# Patient Record
Sex: Male | Born: 1945 | Race: White | Hispanic: No | Marital: Single | Smoking: Former smoker
Health system: Southern US, Community
[De-identification: ages and names within clinical notes are randomized; demographics above are authoritative.]

## PROBLEM LIST (undated history)

## (undated) DIAGNOSIS — I82629 Acute embolism and thrombosis of deep veins of unspecified upper extremity: Secondary | ICD-10-CM

## (undated) DIAGNOSIS — F32A Depression, unspecified: Secondary | ICD-10-CM

## (undated) DIAGNOSIS — N400 Enlarged prostate without lower urinary tract symptoms: Secondary | ICD-10-CM

## (undated) DIAGNOSIS — F329 Major depressive disorder, single episode, unspecified: Secondary | ICD-10-CM

## (undated) DIAGNOSIS — N289 Disorder of kidney and ureter, unspecified: Secondary | ICD-10-CM

## (undated) DIAGNOSIS — A0472 Enterocolitis due to Clostridium difficile, not specified as recurrent: Secondary | ICD-10-CM

## (undated) DIAGNOSIS — M1990 Unspecified osteoarthritis, unspecified site: Secondary | ICD-10-CM

## (undated) DIAGNOSIS — I2510 Atherosclerotic heart disease of native coronary artery without angina pectoris: Secondary | ICD-10-CM

## (undated) DIAGNOSIS — K219 Gastro-esophageal reflux disease without esophagitis: Secondary | ICD-10-CM

## (undated) DIAGNOSIS — I639 Cerebral infarction, unspecified: Secondary | ICD-10-CM

## (undated) DIAGNOSIS — I10 Essential (primary) hypertension: Secondary | ICD-10-CM

## (undated) DIAGNOSIS — F419 Anxiety disorder, unspecified: Secondary | ICD-10-CM

## (undated) DIAGNOSIS — I5022 Chronic systolic (congestive) heart failure: Secondary | ICD-10-CM

## (undated) DIAGNOSIS — E079 Disorder of thyroid, unspecified: Secondary | ICD-10-CM

## (undated) DIAGNOSIS — I6381 Other cerebral infarction due to occlusion or stenosis of small artery: Secondary | ICD-10-CM

## (undated) HISTORY — DX: Chronic systolic (congestive) heart failure: I50.22

## (undated) HISTORY — PX: CORONARY ARTERY BYPASS GRAFT: SHX141

## (undated) HISTORY — DX: Cerebral infarction, unspecified: I63.9

## (undated) HISTORY — DX: Essential (primary) hypertension: I10

## (undated) HISTORY — DX: Disorder of kidney and ureter, unspecified: N28.9

## (undated) HISTORY — DX: Acute embolism and thrombosis of deep veins of unspecified upper extremity: I82.629

## (undated) HISTORY — DX: Enterocolitis due to Clostridium difficile, not specified as recurrent: A04.72

## (undated) HISTORY — DX: Gastro-esophageal reflux disease without esophagitis: K21.9

## (undated) HISTORY — DX: Major depressive disorder, single episode, unspecified: F32.9

## (undated) HISTORY — DX: Depression, unspecified: F32.A

## (undated) HISTORY — DX: Unspecified osteoarthritis, unspecified site: M19.90

## (undated) HISTORY — DX: Atherosclerotic heart disease of native coronary artery without angina pectoris: I25.10

## (undated) HISTORY — DX: Disorder of thyroid, unspecified: E07.9

## (undated) HISTORY — DX: Benign prostatic hyperplasia without lower urinary tract symptoms: N40.0

## (undated) HISTORY — DX: Other cerebral infarction due to occlusion or stenosis of small artery: I63.81

## (undated) HISTORY — DX: Anxiety disorder, unspecified: F41.9

---

## 2004-05-25 ENCOUNTER — Emergency Department: Payer: Self-pay | Admitting: Emergency Medicine

## 2004-05-25 ENCOUNTER — Other Ambulatory Visit: Payer: Self-pay

## 2004-06-26 ENCOUNTER — Other Ambulatory Visit: Payer: Self-pay

## 2004-06-26 ENCOUNTER — Inpatient Hospital Stay: Payer: Self-pay | Admitting: Rheumatology

## 2004-07-08 ENCOUNTER — Other Ambulatory Visit: Payer: Self-pay

## 2004-07-09 ENCOUNTER — Inpatient Hospital Stay: Payer: Self-pay | Admitting: Anesthesiology

## 2004-07-30 ENCOUNTER — Emergency Department: Payer: Self-pay | Admitting: Emergency Medicine

## 2004-10-25 ENCOUNTER — Inpatient Hospital Stay: Payer: Self-pay | Admitting: Internal Medicine

## 2004-10-30 ENCOUNTER — Inpatient Hospital Stay: Payer: Self-pay | Admitting: Internal Medicine

## 2004-11-10 ENCOUNTER — Encounter: Payer: Self-pay | Admitting: Internal Medicine

## 2004-11-30 ENCOUNTER — Encounter: Payer: Self-pay | Admitting: Internal Medicine

## 2004-12-14 ENCOUNTER — Ambulatory Visit: Payer: Self-pay | Admitting: Internal Medicine

## 2004-12-19 ENCOUNTER — Emergency Department: Payer: Self-pay | Admitting: Unknown Physician Specialty

## 2004-12-19 ENCOUNTER — Other Ambulatory Visit: Payer: Self-pay

## 2004-12-23 ENCOUNTER — Ambulatory Visit: Payer: Self-pay | Admitting: Internal Medicine

## 2004-12-23 DIAGNOSIS — I6990 Unspecified sequelae of unspecified cerebrovascular disease: Secondary | ICD-10-CM | POA: Insufficient documentation

## 2004-12-24 ENCOUNTER — Ambulatory Visit: Payer: Self-pay | Admitting: Internal Medicine

## 2005-01-07 ENCOUNTER — Ambulatory Visit: Payer: Self-pay | Admitting: Internal Medicine

## 2005-01-11 ENCOUNTER — Ambulatory Visit: Payer: Self-pay | Admitting: Internal Medicine

## 2005-01-12 ENCOUNTER — Ambulatory Visit: Payer: Self-pay | Admitting: Internal Medicine

## 2005-01-18 ENCOUNTER — Encounter: Payer: Self-pay | Admitting: Internal Medicine

## 2005-01-19 ENCOUNTER — Emergency Department: Payer: Self-pay | Admitting: Emergency Medicine

## 2005-01-26 ENCOUNTER — Ambulatory Visit: Payer: Self-pay | Admitting: Internal Medicine

## 2005-01-30 ENCOUNTER — Encounter: Payer: Self-pay | Admitting: Internal Medicine

## 2005-02-08 ENCOUNTER — Ambulatory Visit: Payer: Self-pay | Admitting: Internal Medicine

## 2005-02-08 ENCOUNTER — Other Ambulatory Visit: Payer: Self-pay

## 2005-02-09 ENCOUNTER — Inpatient Hospital Stay: Payer: Self-pay | Admitting: Internal Medicine

## 2005-02-23 ENCOUNTER — Ambulatory Visit: Payer: Self-pay | Admitting: Internal Medicine

## 2005-03-04 ENCOUNTER — Ambulatory Visit: Payer: Self-pay | Admitting: Surgery

## 2005-03-08 ENCOUNTER — Ambulatory Visit: Payer: Self-pay | Admitting: Specialist

## 2005-03-10 ENCOUNTER — Ambulatory Visit: Payer: Self-pay | Admitting: Internal Medicine

## 2005-03-17 ENCOUNTER — Ambulatory Visit: Payer: Self-pay | Admitting: Internal Medicine

## 2005-03-30 ENCOUNTER — Ambulatory Visit: Payer: Self-pay | Admitting: Internal Medicine

## 2005-04-27 ENCOUNTER — Ambulatory Visit: Payer: Self-pay | Admitting: Internal Medicine

## 2005-05-11 ENCOUNTER — Ambulatory Visit: Payer: Self-pay | Admitting: Internal Medicine

## 2005-05-30 ENCOUNTER — Inpatient Hospital Stay: Payer: Self-pay | Admitting: Infectious Diseases

## 2005-05-30 ENCOUNTER — Other Ambulatory Visit: Payer: Self-pay

## 2005-05-31 ENCOUNTER — Other Ambulatory Visit: Payer: Self-pay

## 2005-06-01 ENCOUNTER — Ambulatory Visit: Payer: Self-pay | Admitting: *Deleted

## 2005-06-08 ENCOUNTER — Ambulatory Visit: Payer: Self-pay | Admitting: Internal Medicine

## 2005-06-11 ENCOUNTER — Ambulatory Visit: Payer: Self-pay | Admitting: Internal Medicine

## 2005-06-17 ENCOUNTER — Ambulatory Visit: Payer: Self-pay | Admitting: Internal Medicine

## 2005-06-23 ENCOUNTER — Ambulatory Visit: Payer: Self-pay | Admitting: Internal Medicine

## 2005-06-28 ENCOUNTER — Ambulatory Visit: Payer: Self-pay | Admitting: Internal Medicine

## 2005-07-01 ENCOUNTER — Ambulatory Visit: Payer: Self-pay | Admitting: Internal Medicine

## 2005-07-05 ENCOUNTER — Ambulatory Visit: Payer: Self-pay | Admitting: Internal Medicine

## 2005-07-07 ENCOUNTER — Ambulatory Visit: Payer: Self-pay | Admitting: Internal Medicine

## 2005-07-14 ENCOUNTER — Ambulatory Visit: Payer: Self-pay | Admitting: Internal Medicine

## 2005-07-19 ENCOUNTER — Ambulatory Visit: Payer: Self-pay | Admitting: Internal Medicine

## 2005-07-27 ENCOUNTER — Ambulatory Visit: Payer: Self-pay | Admitting: Internal Medicine

## 2005-08-03 ENCOUNTER — Ambulatory Visit: Payer: Self-pay | Admitting: Internal Medicine

## 2005-08-12 ENCOUNTER — Ambulatory Visit: Payer: Self-pay | Admitting: Internal Medicine

## 2005-08-16 ENCOUNTER — Ambulatory Visit: Payer: Self-pay | Admitting: Internal Medicine

## 2005-08-30 ENCOUNTER — Ambulatory Visit: Payer: Self-pay | Admitting: Internal Medicine

## 2005-09-06 ENCOUNTER — Ambulatory Visit: Payer: Self-pay | Admitting: Internal Medicine

## 2005-09-20 ENCOUNTER — Ambulatory Visit: Payer: Self-pay | Admitting: Internal Medicine

## 2005-10-04 ENCOUNTER — Ambulatory Visit: Payer: Self-pay | Admitting: Internal Medicine

## 2005-10-05 ENCOUNTER — Ambulatory Visit: Payer: Self-pay | Admitting: Internal Medicine

## 2005-10-09 ENCOUNTER — Other Ambulatory Visit: Payer: Self-pay

## 2005-10-09 ENCOUNTER — Inpatient Hospital Stay: Payer: Self-pay | Admitting: Internal Medicine

## 2005-10-20 ENCOUNTER — Ambulatory Visit: Payer: Self-pay | Admitting: Internal Medicine

## 2005-10-28 ENCOUNTER — Ambulatory Visit: Payer: Self-pay | Admitting: Internal Medicine

## 2005-12-30 ENCOUNTER — Ambulatory Visit: Payer: Self-pay | Admitting: Internal Medicine

## 2006-01-20 ENCOUNTER — Emergency Department: Payer: Self-pay | Admitting: Emergency Medicine

## 2006-01-20 ENCOUNTER — Other Ambulatory Visit: Payer: Self-pay

## 2006-04-29 ENCOUNTER — Ambulatory Visit: Payer: Self-pay | Admitting: Internal Medicine

## 2006-05-25 ENCOUNTER — Ambulatory Visit: Payer: Self-pay | Admitting: Nephrology

## 2006-05-30 ENCOUNTER — Ambulatory Visit: Payer: Self-pay | Admitting: Internal Medicine

## 2006-07-01 ENCOUNTER — Ambulatory Visit: Payer: Self-pay | Admitting: Internal Medicine

## 2006-07-08 ENCOUNTER — Ambulatory Visit: Payer: Self-pay | Admitting: Internal Medicine

## 2006-09-29 ENCOUNTER — Ambulatory Visit: Payer: Self-pay | Admitting: Internal Medicine

## 2006-10-07 ENCOUNTER — Ambulatory Visit: Payer: Self-pay | Admitting: Internal Medicine

## 2006-10-15 DIAGNOSIS — F3289 Other specified depressive episodes: Secondary | ICD-10-CM | POA: Insufficient documentation

## 2006-10-15 DIAGNOSIS — K219 Gastro-esophageal reflux disease without esophagitis: Secondary | ICD-10-CM

## 2006-10-15 DIAGNOSIS — F329 Major depressive disorder, single episode, unspecified: Secondary | ICD-10-CM

## 2006-10-15 DIAGNOSIS — I2510 Atherosclerotic heart disease of native coronary artery without angina pectoris: Secondary | ICD-10-CM | POA: Insufficient documentation

## 2006-10-15 DIAGNOSIS — E039 Hypothyroidism, unspecified: Secondary | ICD-10-CM | POA: Insufficient documentation

## 2006-10-15 DIAGNOSIS — N400 Enlarged prostate without lower urinary tract symptoms: Secondary | ICD-10-CM | POA: Insufficient documentation

## 2006-11-29 ENCOUNTER — Ambulatory Visit: Payer: Self-pay | Admitting: Urology

## 2006-12-12 ENCOUNTER — Ambulatory Visit: Payer: Self-pay | Admitting: Internal Medicine

## 2006-12-12 DIAGNOSIS — E785 Hyperlipidemia, unspecified: Secondary | ICD-10-CM

## 2006-12-12 DIAGNOSIS — E1149 Type 2 diabetes mellitus with other diabetic neurological complication: Secondary | ICD-10-CM | POA: Insufficient documentation

## 2006-12-14 LAB — CONVERTED CEMR LAB
ALT: 13 units/L (ref 0–40)
Albumin: 3 g/dL — ABNORMAL LOW (ref 3.5–5.2)
BUN: 27 mg/dL — ABNORMAL HIGH (ref 6–23)
CO2: 34 meq/L — ABNORMAL HIGH (ref 19–32)
Calcium: 9.3 mg/dL (ref 8.4–10.5)
Chloride: 112 meq/L (ref 96–112)
Cholesterol: 145 mg/dL (ref 0–200)
Creatinine, Ser: 2.4 mg/dL — ABNORMAL HIGH (ref 0.4–1.5)
Creatinine,U: 104.1 mg/dL
Free T4: 0.9 ng/dL (ref 0.6–1.6)
GFR calc Af Amer: 35 mL/min
GFR calc non Af Amer: 29 mL/min
Glucose, Bld: 42 mg/dL — CL (ref 70–99)
HDL: 47 mg/dL (ref >39.0)
LDL Cholesterol: 70 mg/dL (ref 0–99)
Microalb Creat Ratio: 2836.7 mg/g — ABNORMAL HIGH (ref 0.0–30.0)
Microalb, Ur: 295.3 mg/dL (ref 0.0–1.9)
Phosphorus: 5.2 mg/dL — ABNORMAL HIGH (ref 2.3–4.6)
Potassium: 5.9 meq/L — ABNORMAL HIGH (ref 3.5–5.1)
Sodium: 149 meq/L — ABNORMAL HIGH (ref 135–145)
TSH: 3.52 microintl units/mL (ref 0.35–5.50)
Total CHOL/HDL Ratio: 3.1
Triglycerides: 140 mg/dL (ref 0–149)
VLDL: 28 mg/dL (ref 0–40)

## 2006-12-28 ENCOUNTER — Telehealth (INDEPENDENT_AMBULATORY_CARE_PROVIDER_SITE_OTHER): Payer: Self-pay | Admitting: *Deleted

## 2007-01-09 ENCOUNTER — Encounter (INDEPENDENT_AMBULATORY_CARE_PROVIDER_SITE_OTHER): Payer: Self-pay | Admitting: *Deleted

## 2007-01-12 ENCOUNTER — Telehealth (INDEPENDENT_AMBULATORY_CARE_PROVIDER_SITE_OTHER): Payer: Self-pay | Admitting: *Deleted

## 2007-01-23 ENCOUNTER — Emergency Department: Payer: Self-pay | Admitting: Internal Medicine

## 2007-01-27 ENCOUNTER — Ambulatory Visit: Payer: Self-pay | Admitting: Internal Medicine

## 2007-01-27 DIAGNOSIS — I10 Essential (primary) hypertension: Secondary | ICD-10-CM | POA: Insufficient documentation

## 2007-02-07 ENCOUNTER — Telehealth (INDEPENDENT_AMBULATORY_CARE_PROVIDER_SITE_OTHER): Payer: Self-pay | Admitting: *Deleted

## 2007-02-16 ENCOUNTER — Encounter (INDEPENDENT_AMBULATORY_CARE_PROVIDER_SITE_OTHER): Payer: Self-pay | Admitting: *Deleted

## 2007-02-22 ENCOUNTER — Inpatient Hospital Stay: Payer: Self-pay | Admitting: Internal Medicine

## 2007-02-22 ENCOUNTER — Other Ambulatory Visit: Payer: Self-pay

## 2007-02-22 ENCOUNTER — Encounter (INDEPENDENT_AMBULATORY_CARE_PROVIDER_SITE_OTHER): Payer: Self-pay | Admitting: *Deleted

## 2007-02-23 ENCOUNTER — Telehealth: Payer: Self-pay | Admitting: Internal Medicine

## 2007-02-23 ENCOUNTER — Other Ambulatory Visit: Payer: Self-pay

## 2007-02-24 ENCOUNTER — Encounter: Payer: Self-pay | Admitting: Internal Medicine

## 2007-02-27 ENCOUNTER — Ambulatory Visit: Payer: Self-pay | Admitting: Internal Medicine

## 2007-02-27 DIAGNOSIS — I5022 Chronic systolic (congestive) heart failure: Secondary | ICD-10-CM | POA: Insufficient documentation

## 2007-03-09 ENCOUNTER — Telehealth (INDEPENDENT_AMBULATORY_CARE_PROVIDER_SITE_OTHER): Payer: Self-pay | Admitting: *Deleted

## 2007-03-17 ENCOUNTER — Ambulatory Visit: Payer: Self-pay | Admitting: Internal Medicine

## 2007-03-20 LAB — CONVERTED CEMR LAB: Hgb A1c MFr Bld: 7.9 % — ABNORMAL HIGH (ref 4.6–6.1)

## 2007-03-30 ENCOUNTER — Ambulatory Visit: Payer: Self-pay | Admitting: Internal Medicine

## 2007-03-30 LAB — CONVERTED CEMR LAB: Glucose, Bld: 212 mg/dL

## 2007-04-04 ENCOUNTER — Ambulatory Visit: Payer: Self-pay | Admitting: Internal Medicine

## 2007-04-20 ENCOUNTER — Encounter: Payer: Self-pay | Admitting: Internal Medicine

## 2007-05-10 ENCOUNTER — Telehealth: Payer: Self-pay | Admitting: Internal Medicine

## 2007-05-30 ENCOUNTER — Encounter: Payer: Self-pay | Admitting: Internal Medicine

## 2007-06-02 ENCOUNTER — Telehealth (INDEPENDENT_AMBULATORY_CARE_PROVIDER_SITE_OTHER): Payer: Self-pay | Admitting: *Deleted

## 2007-06-05 ENCOUNTER — Telehealth (INDEPENDENT_AMBULATORY_CARE_PROVIDER_SITE_OTHER): Payer: Self-pay | Admitting: *Deleted

## 2007-06-22 ENCOUNTER — Telehealth: Payer: Self-pay | Admitting: Internal Medicine

## 2007-07-05 ENCOUNTER — Encounter: Payer: Self-pay | Admitting: Internal Medicine

## 2007-07-05 ENCOUNTER — Other Ambulatory Visit: Payer: Self-pay

## 2007-07-05 ENCOUNTER — Inpatient Hospital Stay: Payer: Self-pay | Admitting: Internal Medicine

## 2007-07-10 ENCOUNTER — Encounter: Payer: Self-pay | Admitting: Internal Medicine

## 2007-07-11 ENCOUNTER — Telehealth (INDEPENDENT_AMBULATORY_CARE_PROVIDER_SITE_OTHER): Payer: Self-pay | Admitting: *Deleted

## 2007-07-11 ENCOUNTER — Other Ambulatory Visit: Payer: Self-pay

## 2007-07-11 ENCOUNTER — Encounter: Payer: Self-pay | Admitting: Internal Medicine

## 2007-07-11 ENCOUNTER — Observation Stay: Payer: Self-pay | Admitting: Internal Medicine

## 2007-07-27 ENCOUNTER — Inpatient Hospital Stay: Payer: Self-pay | Admitting: Internal Medicine

## 2007-07-27 ENCOUNTER — Other Ambulatory Visit: Payer: Self-pay

## 2007-08-30 ENCOUNTER — Telehealth (INDEPENDENT_AMBULATORY_CARE_PROVIDER_SITE_OTHER): Payer: Self-pay | Admitting: *Deleted

## 2007-08-31 ENCOUNTER — Ambulatory Visit: Payer: Self-pay | Admitting: Internal Medicine

## 2007-09-04 LAB — CONVERTED CEMR LAB
ALT: 11 units/L (ref 0–53)
AST: 14 units/L (ref 0–37)
Albumin: 3.3 g/dL — ABNORMAL LOW (ref 3.5–5.2)
Alkaline Phosphatase: 99 units/L (ref 39–117)
BUN: 44 mg/dL — ABNORMAL HIGH (ref 6–23)
Basophils Absolute: 0.2 10*3/uL — ABNORMAL HIGH (ref 0.0–0.1)
Basophils Relative: 1.8 % — ABNORMAL HIGH (ref 0.0–1.0)
Bilirubin, Direct: 0.1 mg/dL (ref 0.0–0.3)
CO2: 26 meq/L (ref 19–32)
Calcium: 8.9 mg/dL (ref 8.4–10.5)
Chloride: 107 meq/L (ref 96–112)
Cholesterol: 125 mg/dL (ref 0–200)
Creatinine, Ser: 3.1 mg/dL — ABNORMAL HIGH (ref 0.4–1.5)
Eosinophils Absolute: 0.4 10*3/uL (ref 0.0–0.6)
Eosinophils Relative: 4.9 % (ref 0.0–5.0)
GFR calc Af Amer: 26 mL/min
GFR calc non Af Amer: 22 mL/min
Glucose, Bld: 171 mg/dL — ABNORMAL HIGH (ref 70–99)
HCT: 34.1 % — ABNORMAL LOW (ref 39.0–52.0)
HDL: 38.2 mg/dL — ABNORMAL LOW (ref >39.0)
Hemoglobin: 11.1 g/dL — ABNORMAL LOW (ref 13.0–17.0)
Hgb A1c MFr Bld: 9.1 % — ABNORMAL HIGH (ref 4.6–6.0)
LDL Cholesterol: 53 mg/dL (ref 0–99)
Lymphocytes Relative: 17.3 % (ref 12.0–46.0)
MCHC: 32.7 g/dL (ref 30.0–36.0)
MCV: 96.1 fL (ref 78.0–100.0)
Monocytes Absolute: 0.9 10*3/uL — ABNORMAL HIGH (ref 0.2–0.7)
Monocytes Relative: 10.6 % (ref 3.0–11.0)
Neutro Abs: 5.9 10*3/uL (ref 1.4–7.7)
Neutrophils Relative %: 65.4 % (ref 43.0–77.0)
Phosphorus: 6.4 mg/dL — ABNORMAL HIGH (ref 2.3–4.6)
Platelets: 222 10*3/uL (ref 150–400)
Potassium: 5.7 meq/L — ABNORMAL HIGH (ref 3.5–5.1)
RBC: 3.55 M/uL — ABNORMAL LOW (ref 4.22–5.81)
RDW: 13.3 % (ref 11.5–14.6)
Sodium: 142 meq/L (ref 135–145)
TSH: 3.56 microintl units/mL (ref 0.35–5.50)
Total Bilirubin: 0.6 mg/dL (ref 0.3–1.2)
Total CHOL/HDL Ratio: 3.3
Total Protein: 6.7 g/dL (ref 6.0–8.3)
Triglycerides: 167 mg/dL — ABNORMAL HIGH (ref 0–149)
VLDL: 33 mg/dL (ref 0–40)
WBC: 8.9 10*3/uL (ref 4.5–10.5)

## 2007-09-11 ENCOUNTER — Other Ambulatory Visit: Payer: Self-pay

## 2007-09-12 ENCOUNTER — Observation Stay: Payer: Self-pay | Admitting: Internal Medicine

## 2007-09-14 ENCOUNTER — Encounter: Payer: Self-pay | Admitting: Internal Medicine

## 2007-09-28 ENCOUNTER — Encounter (INDEPENDENT_AMBULATORY_CARE_PROVIDER_SITE_OTHER): Payer: Self-pay | Admitting: *Deleted

## 2007-10-10 ENCOUNTER — Encounter: Payer: Self-pay | Admitting: Internal Medicine

## 2007-10-14 ENCOUNTER — Emergency Department: Payer: Self-pay | Admitting: Internal Medicine

## 2007-10-14 ENCOUNTER — Other Ambulatory Visit: Payer: Self-pay

## 2007-10-21 ENCOUNTER — Inpatient Hospital Stay: Payer: Self-pay | Admitting: Internal Medicine

## 2007-10-21 ENCOUNTER — Other Ambulatory Visit: Payer: Self-pay

## 2007-10-23 ENCOUNTER — Encounter: Payer: Self-pay | Admitting: Internal Medicine

## 2007-10-26 ENCOUNTER — Encounter: Payer: Self-pay | Admitting: Internal Medicine

## 2007-11-01 ENCOUNTER — Encounter: Payer: Self-pay | Admitting: Internal Medicine

## 2007-11-20 ENCOUNTER — Telehealth (INDEPENDENT_AMBULATORY_CARE_PROVIDER_SITE_OTHER): Payer: Self-pay | Admitting: *Deleted

## 2007-11-21 ENCOUNTER — Telehealth (INDEPENDENT_AMBULATORY_CARE_PROVIDER_SITE_OTHER): Payer: Self-pay | Admitting: *Deleted

## 2007-11-23 ENCOUNTER — Telehealth (INDEPENDENT_AMBULATORY_CARE_PROVIDER_SITE_OTHER): Payer: Self-pay | Admitting: *Deleted

## 2007-11-29 ENCOUNTER — Ambulatory Visit: Payer: Self-pay | Admitting: Internal Medicine

## 2007-11-30 ENCOUNTER — Encounter: Payer: Self-pay | Admitting: Internal Medicine

## 2007-12-04 ENCOUNTER — Other Ambulatory Visit: Payer: Self-pay

## 2007-12-04 ENCOUNTER — Encounter: Payer: Self-pay | Admitting: Internal Medicine

## 2007-12-05 ENCOUNTER — Encounter: Payer: Self-pay | Admitting: Internal Medicine

## 2007-12-05 ENCOUNTER — Inpatient Hospital Stay: Payer: Self-pay | Admitting: Internal Medicine

## 2007-12-11 ENCOUNTER — Other Ambulatory Visit: Payer: Self-pay

## 2007-12-11 ENCOUNTER — Inpatient Hospital Stay: Payer: Self-pay | Admitting: Internal Medicine

## 2007-12-16 ENCOUNTER — Encounter: Payer: Self-pay | Admitting: Internal Medicine

## 2007-12-18 ENCOUNTER — Encounter (INDEPENDENT_AMBULATORY_CARE_PROVIDER_SITE_OTHER): Payer: Self-pay | Admitting: *Deleted

## 2007-12-22 ENCOUNTER — Ambulatory Visit: Payer: Self-pay | Admitting: Internal Medicine

## 2008-03-05 ENCOUNTER — Encounter: Payer: Self-pay | Admitting: Internal Medicine

## 2008-03-14 ENCOUNTER — Telehealth: Payer: Self-pay | Admitting: Family Medicine

## 2008-04-10 ENCOUNTER — Encounter: Payer: Self-pay | Admitting: Internal Medicine

## 2008-05-16 ENCOUNTER — Telehealth: Payer: Self-pay | Admitting: Internal Medicine

## 2008-06-05 ENCOUNTER — Ambulatory Visit: Payer: Self-pay | Admitting: Internal Medicine

## 2008-06-10 LAB — CONVERTED CEMR LAB
ALT: 16 units/L (ref 0–53)
AST: 21 units/L (ref 0–37)
Albumin: 3.9 g/dL (ref 3.5–5.2)
Alkaline Phosphatase: 135 units/L — ABNORMAL HIGH (ref 39–117)
BUN: 40 mg/dL — ABNORMAL HIGH (ref 6–23)
Basophils Absolute: 0.1 10*3/uL (ref 0.0–0.1)
Basophils Relative: 1.2 % (ref 0.0–3.0)
Bilirubin, Direct: 0.1 mg/dL (ref 0.0–0.3)
CO2: 26 meq/L (ref 19–32)
Calcium: 10 mg/dL (ref 8.4–10.5)
Chloride: 106 meq/L (ref 96–112)
Cholesterol: 211 mg/dL (ref 0–200)
Creatinine, Ser: 2.9 mg/dL — ABNORMAL HIGH (ref 0.4–1.5)
Direct LDL: 96.5 mg/dL
Eosinophils Absolute: 0.3 10*3/uL (ref 0.0–0.7)
Eosinophils Relative: 4.7 % (ref 0.0–5.0)
Free T4: 0.9 ng/dL (ref 0.6–1.6)
GFR calc Af Amer: 28 mL/min
GFR calc non Af Amer: 23 mL/min
Glucose, Bld: 118 mg/dL — ABNORMAL HIGH (ref 70–99)
HCT: 36.6 % — ABNORMAL LOW (ref 39.0–52.0)
HDL: 54.4 mg/dL (ref >39.0)
Hemoglobin: 12.4 g/dL — ABNORMAL LOW (ref 13.0–17.0)
Hgb A1c MFr Bld: 10.5 % — ABNORMAL HIGH (ref 4.6–6.0)
Lymphocytes Relative: 25.7 % (ref 12.0–46.0)
MCHC: 33.8 g/dL (ref 30.0–36.0)
MCV: 94.7 fL (ref 78.0–100.0)
Monocytes Absolute: 0.8 10*3/uL (ref 0.1–1.0)
Monocytes Relative: 11.8 % (ref 3.0–12.0)
Neutro Abs: 3.8 10*3/uL (ref 1.4–7.7)
Neutrophils Relative %: 56.6 % (ref 43.0–77.0)
Phosphorus: 3.7 mg/dL (ref 2.3–4.6)
Platelets: 217 10*3/uL (ref 150–400)
Potassium: 5.5 meq/L — ABNORMAL HIGH (ref 3.5–5.1)
RBC: 3.87 M/uL — ABNORMAL LOW (ref 4.22–5.81)
RDW: 13.3 % (ref 11.5–14.6)
Sodium: 142 meq/L (ref 135–145)
TSH: 5.35 microintl units/mL (ref 0.35–5.50)
Total Bilirubin: 0.5 mg/dL (ref 0.3–1.2)
Total CHOL/HDL Ratio: 3.9
Total Protein: 7.6 g/dL (ref 6.0–8.3)
Triglycerides: 302 mg/dL (ref 0–149)
VLDL: 60 mg/dL — ABNORMAL HIGH (ref 0–40)
WBC: 6.7 10*3/uL (ref 4.5–10.5)

## 2008-07-17 ENCOUNTER — Inpatient Hospital Stay: Payer: Self-pay | Admitting: Internal Medicine

## 2008-07-18 ENCOUNTER — Encounter: Payer: Self-pay | Admitting: Internal Medicine

## 2008-07-25 ENCOUNTER — Emergency Department: Payer: Self-pay | Admitting: Emergency Medicine

## 2008-07-30 ENCOUNTER — Ambulatory Visit: Payer: Self-pay | Admitting: Internal Medicine

## 2008-07-30 DIAGNOSIS — F411 Generalized anxiety disorder: Secondary | ICD-10-CM | POA: Insufficient documentation

## 2008-08-03 ENCOUNTER — Emergency Department: Payer: Self-pay | Admitting: Emergency Medicine

## 2008-08-09 ENCOUNTER — Ambulatory Visit: Payer: Self-pay | Admitting: Family Medicine

## 2008-08-09 DIAGNOSIS — J309 Allergic rhinitis, unspecified: Secondary | ICD-10-CM | POA: Insufficient documentation

## 2008-08-20 ENCOUNTER — Emergency Department: Payer: Self-pay | Admitting: Emergency Medicine

## 2008-08-21 ENCOUNTER — Telehealth: Payer: Self-pay | Admitting: Internal Medicine

## 2008-08-28 ENCOUNTER — Ambulatory Visit: Payer: Self-pay | Admitting: Internal Medicine

## 2008-09-19 ENCOUNTER — Inpatient Hospital Stay: Payer: Self-pay | Admitting: Specialist

## 2008-09-20 ENCOUNTER — Encounter: Payer: Self-pay | Admitting: Internal Medicine

## 2008-09-24 ENCOUNTER — Encounter: Payer: Self-pay | Admitting: Internal Medicine

## 2008-09-29 ENCOUNTER — Emergency Department: Payer: Self-pay | Admitting: Emergency Medicine

## 2008-10-17 ENCOUNTER — Ambulatory Visit: Payer: Self-pay | Admitting: Vascular Surgery

## 2008-10-21 ENCOUNTER — Encounter: Payer: Self-pay | Admitting: Internal Medicine

## 2008-10-21 ENCOUNTER — Inpatient Hospital Stay: Payer: Self-pay | Admitting: Internal Medicine

## 2008-10-23 ENCOUNTER — Encounter: Payer: Self-pay | Admitting: Internal Medicine

## 2008-10-24 ENCOUNTER — Telehealth: Payer: Self-pay | Admitting: Internal Medicine

## 2008-10-25 ENCOUNTER — Encounter: Payer: Self-pay | Admitting: Internal Medicine

## 2008-10-29 ENCOUNTER — Telehealth: Payer: Self-pay | Admitting: Internal Medicine

## 2008-11-04 ENCOUNTER — Telehealth: Payer: Self-pay | Admitting: Internal Medicine

## 2008-11-05 ENCOUNTER — Telehealth: Payer: Self-pay | Admitting: Internal Medicine

## 2008-11-06 ENCOUNTER — Telehealth: Payer: Self-pay | Admitting: Internal Medicine

## 2008-11-06 ENCOUNTER — Encounter: Payer: Self-pay | Admitting: Internal Medicine

## 2008-11-07 ENCOUNTER — Ambulatory Visit: Payer: Self-pay | Admitting: Internal Medicine

## 2008-11-07 DIAGNOSIS — N186 End stage renal disease: Secondary | ICD-10-CM | POA: Insufficient documentation

## 2008-11-14 ENCOUNTER — Ambulatory Visit: Payer: Self-pay | Admitting: Vascular Surgery

## 2008-11-19 ENCOUNTER — Telehealth: Payer: Self-pay | Admitting: Internal Medicine

## 2008-11-20 ENCOUNTER — Ambulatory Visit: Payer: Self-pay | Admitting: Vascular Surgery

## 2008-11-22 ENCOUNTER — Telehealth: Payer: Self-pay | Admitting: Internal Medicine

## 2008-11-22 ENCOUNTER — Ambulatory Visit: Payer: Self-pay | Admitting: Vascular Surgery

## 2008-11-27 ENCOUNTER — Inpatient Hospital Stay: Payer: Self-pay | Admitting: Internal Medicine

## 2008-11-28 ENCOUNTER — Encounter: Payer: Self-pay | Admitting: Internal Medicine

## 2008-12-05 ENCOUNTER — Ambulatory Visit: Payer: Self-pay | Admitting: Family Medicine

## 2008-12-05 ENCOUNTER — Telehealth: Payer: Self-pay | Admitting: Family Medicine

## 2008-12-06 ENCOUNTER — Ambulatory Visit: Payer: Self-pay | Admitting: Family Medicine

## 2008-12-13 ENCOUNTER — Emergency Department: Payer: Self-pay | Admitting: Emergency Medicine

## 2008-12-15 ENCOUNTER — Inpatient Hospital Stay: Payer: Self-pay | Admitting: Internal Medicine

## 2008-12-17 ENCOUNTER — Encounter: Payer: Self-pay | Admitting: Internal Medicine

## 2008-12-19 ENCOUNTER — Emergency Department: Payer: Self-pay | Admitting: Emergency Medicine

## 2008-12-20 ENCOUNTER — Inpatient Hospital Stay: Payer: Self-pay | Admitting: Internal Medicine

## 2008-12-20 ENCOUNTER — Encounter: Payer: Self-pay | Admitting: Internal Medicine

## 2008-12-24 ENCOUNTER — Encounter: Payer: Self-pay | Admitting: Internal Medicine

## 2008-12-31 ENCOUNTER — Ambulatory Visit: Payer: Self-pay | Admitting: Internal Medicine

## 2009-01-16 ENCOUNTER — Telehealth: Payer: Self-pay | Admitting: Internal Medicine

## 2009-01-28 ENCOUNTER — Encounter: Payer: Self-pay | Admitting: Internal Medicine

## 2009-01-28 ENCOUNTER — Telehealth: Payer: Self-pay | Admitting: Family Medicine

## 2009-02-04 ENCOUNTER — Telehealth: Payer: Self-pay | Admitting: Family Medicine

## 2009-02-11 ENCOUNTER — Ambulatory Visit: Payer: Self-pay | Admitting: Internal Medicine

## 2009-02-12 LAB — CONVERTED CEMR LAB
ALT: 15 units/L (ref 0–53)
AST: 27 units/L (ref 0–37)
Albumin: 3.5 g/dL (ref 3.5–5.2)
Alkaline Phosphatase: 115 units/L (ref 39–117)
Basophils Absolute: 0.1 10*3/uL (ref 0.0–0.1)
Basophils Relative: 0.9 % (ref 0.0–3.0)
Bilirubin, Direct: 0 mg/dL (ref 0.0–0.3)
Cholesterol: 111 mg/dL (ref 0–200)
Eosinophils Absolute: 0.5 10*3/uL (ref 0.0–0.7)
Eosinophils Relative: 5.7 % — ABNORMAL HIGH (ref 0.0–5.0)
HCT: 40 % (ref 39.0–52.0)
HDL: 39.7 mg/dL (ref >39.00)
Hemoglobin: 13.3 g/dL (ref 13.0–17.0)
Hgb A1c MFr Bld: 8.3 % — ABNORMAL HIGH (ref 4.6–6.5)
LDL Cholesterol: 48 mg/dL (ref 0–99)
Lymphocytes Relative: 20.9 % (ref 12.0–46.0)
Lymphs Abs: 2 10*3/uL (ref 0.7–4.0)
MCHC: 33.4 g/dL (ref 30.0–36.0)
MCV: 94.8 fL (ref 78.0–100.0)
Monocytes Absolute: 0.9 10*3/uL (ref 0.1–1.0)
Monocytes Relative: 9.1 % (ref 3.0–12.0)
Neutro Abs: 6.1 10*3/uL (ref 1.4–7.7)
Neutrophils Relative %: 63.4 % (ref 43.0–77.0)
Platelets: 190 10*3/uL (ref 150.0–400.0)
RBC: 4.21 M/uL — ABNORMAL LOW (ref 4.22–5.81)
RDW: 15.4 % — ABNORMAL HIGH (ref 11.5–14.6)
TSH: 2.02 microintl units/mL (ref 0.35–5.50)
Total Bilirubin: 0.4 mg/dL (ref 0.3–1.2)
Total CHOL/HDL Ratio: 3
Total Protein: 6.8 g/dL (ref 6.0–8.3)
Triglycerides: 117 mg/dL (ref 0.0–149.0)
VLDL: 23.4 mg/dL (ref 0.0–40.0)
WBC: 9.6 10*3/uL (ref 4.5–10.5)

## 2009-02-17 ENCOUNTER — Emergency Department: Payer: Self-pay | Admitting: Emergency Medicine

## 2009-03-07 ENCOUNTER — Inpatient Hospital Stay: Payer: Self-pay | Admitting: Internal Medicine

## 2009-03-08 ENCOUNTER — Encounter: Payer: Self-pay | Admitting: Internal Medicine

## 2009-03-11 ENCOUNTER — Encounter: Payer: Self-pay | Admitting: Internal Medicine

## 2009-03-12 ENCOUNTER — Encounter: Payer: Self-pay | Admitting: Internal Medicine

## 2009-03-20 ENCOUNTER — Encounter: Payer: Self-pay | Admitting: Internal Medicine

## 2009-03-21 ENCOUNTER — Ambulatory Visit: Payer: Self-pay

## 2009-03-31 ENCOUNTER — Ambulatory Visit: Payer: Self-pay | Admitting: Vascular Surgery

## 2009-04-01 ENCOUNTER — Ambulatory Visit: Payer: Self-pay | Admitting: Internal Medicine

## 2009-04-01 DIAGNOSIS — M1990 Unspecified osteoarthritis, unspecified site: Secondary | ICD-10-CM | POA: Insufficient documentation

## 2009-04-01 DIAGNOSIS — M6530 Trigger finger, unspecified finger: Secondary | ICD-10-CM | POA: Insufficient documentation

## 2009-04-15 ENCOUNTER — Ambulatory Visit: Payer: Self-pay | Admitting: Family Medicine

## 2009-05-14 ENCOUNTER — Inpatient Hospital Stay: Payer: Self-pay | Admitting: Internal Medicine

## 2009-05-15 ENCOUNTER — Encounter: Payer: Self-pay | Admitting: Internal Medicine

## 2009-06-10 ENCOUNTER — Telehealth: Payer: Self-pay | Admitting: Internal Medicine

## 2009-06-19 ENCOUNTER — Emergency Department: Payer: Self-pay | Admitting: Emergency Medicine

## 2009-06-23 ENCOUNTER — Encounter: Payer: Self-pay | Admitting: Internal Medicine

## 2009-07-08 ENCOUNTER — Encounter: Payer: Self-pay | Admitting: Internal Medicine

## 2009-07-21 ENCOUNTER — Inpatient Hospital Stay: Payer: Self-pay | Admitting: Internal Medicine

## 2009-07-23 ENCOUNTER — Encounter: Payer: Self-pay | Admitting: Internal Medicine

## 2009-08-24 ENCOUNTER — Encounter: Payer: Self-pay | Admitting: Internal Medicine

## 2009-08-24 ENCOUNTER — Ambulatory Visit: Payer: Self-pay | Admitting: Cardiology

## 2009-08-24 ENCOUNTER — Inpatient Hospital Stay: Payer: Self-pay | Admitting: Internal Medicine

## 2009-08-26 ENCOUNTER — Encounter: Payer: Self-pay | Admitting: Internal Medicine

## 2009-08-28 ENCOUNTER — Encounter: Payer: Self-pay | Admitting: Internal Medicine

## 2009-08-29 ENCOUNTER — Encounter: Payer: Self-pay | Admitting: Internal Medicine

## 2009-09-02 ENCOUNTER — Ambulatory Visit: Payer: Self-pay | Admitting: Internal Medicine

## 2009-09-10 ENCOUNTER — Telehealth: Payer: Self-pay | Admitting: Internal Medicine

## 2009-09-17 ENCOUNTER — Telehealth: Payer: Self-pay | Admitting: Internal Medicine

## 2009-09-20 ENCOUNTER — Inpatient Hospital Stay: Payer: Self-pay | Admitting: Student

## 2009-09-29 ENCOUNTER — Encounter: Payer: Self-pay | Admitting: Internal Medicine

## 2009-10-01 ENCOUNTER — Inpatient Hospital Stay: Payer: Self-pay | Admitting: *Deleted

## 2009-10-02 ENCOUNTER — Encounter: Payer: Self-pay | Admitting: Internal Medicine

## 2009-10-03 ENCOUNTER — Encounter: Payer: Self-pay | Admitting: Internal Medicine

## 2009-10-07 ENCOUNTER — Emergency Department: Payer: Self-pay | Admitting: Emergency Medicine

## 2009-10-09 ENCOUNTER — Ambulatory Visit: Payer: Self-pay | Admitting: Internal Medicine

## 2009-10-14 ENCOUNTER — Ambulatory Visit: Payer: Self-pay | Admitting: Internal Medicine

## 2009-10-14 LAB — CONVERTED CEMR LAB: INR: 1.2

## 2009-10-21 ENCOUNTER — Ambulatory Visit: Payer: Self-pay | Admitting: Internal Medicine

## 2009-10-21 LAB — CONVERTED CEMR LAB: INR: 1.4

## 2009-10-22 ENCOUNTER — Encounter: Payer: Self-pay | Admitting: Internal Medicine

## 2009-10-23 ENCOUNTER — Ambulatory Visit: Payer: Self-pay | Admitting: Vascular Surgery

## 2009-11-04 ENCOUNTER — Ambulatory Visit: Payer: Self-pay | Admitting: Internal Medicine

## 2009-11-04 LAB — CONVERTED CEMR LAB: INR: 2.8

## 2009-11-11 ENCOUNTER — Encounter: Payer: Self-pay | Admitting: Internal Medicine

## 2009-11-11 ENCOUNTER — Ambulatory Visit: Payer: Self-pay | Admitting: Vascular Surgery

## 2009-11-13 ENCOUNTER — Ambulatory Visit: Payer: Self-pay | Admitting: Internal Medicine

## 2009-11-17 LAB — CONVERTED CEMR LAB
ALT: 11 units/L (ref 0–53)
Alkaline Phosphatase: 137 units/L — ABNORMAL HIGH (ref 39–117)
Bilirubin, Direct: 0.1 mg/dL (ref 0.0–0.3)
Free T4: 1 ng/dL (ref 0.6–1.6)
Hgb A1c MFr Bld: 8.3 % — ABNORMAL HIGH (ref 4.6–6.5)
Total Bilirubin: 0.3 mg/dL (ref 0.3–1.2)
VLDL: 28.2 mg/dL (ref 0.0–40.0)

## 2009-12-04 ENCOUNTER — Encounter: Payer: Self-pay | Admitting: Internal Medicine

## 2009-12-11 ENCOUNTER — Telehealth: Payer: Self-pay | Admitting: Internal Medicine

## 2010-01-06 ENCOUNTER — Ambulatory Visit: Payer: Self-pay | Admitting: Internal Medicine

## 2010-01-13 ENCOUNTER — Encounter: Payer: Self-pay | Admitting: Internal Medicine

## 2010-01-22 ENCOUNTER — Ambulatory Visit: Payer: Self-pay | Admitting: Family Medicine

## 2010-02-11 ENCOUNTER — Telehealth: Payer: Self-pay | Admitting: Family Medicine

## 2010-04-14 ENCOUNTER — Ambulatory Visit: Payer: Self-pay | Admitting: Internal Medicine

## 2010-04-16 LAB — CONVERTED CEMR LAB
Basophils Relative: 0.7 % (ref 0.0–3.0)
Eosinophils Relative: 4.5 % (ref 0.0–5.0)
Lymphocytes Relative: 23.4 % (ref 12.0–46.0)
MCV: 100.4 fL — ABNORMAL HIGH (ref 78.0–100.0)
Neutrophils Relative %: 59.6 % (ref 43.0–77.0)
RBC: 3.4 M/uL — ABNORMAL LOW (ref 4.22–5.81)
WBC: 7.6 10*3/uL (ref 4.5–10.5)

## 2010-04-24 LAB — HM DIABETES FOOT EXAM

## 2010-06-05 ENCOUNTER — Inpatient Hospital Stay: Payer: Self-pay | Admitting: Internal Medicine

## 2010-06-08 ENCOUNTER — Telehealth: Payer: Self-pay | Admitting: Internal Medicine

## 2010-06-12 ENCOUNTER — Encounter: Payer: Self-pay | Admitting: Internal Medicine

## 2010-06-18 ENCOUNTER — Encounter: Payer: Self-pay | Admitting: Internal Medicine

## 2010-07-28 ENCOUNTER — Ambulatory Visit: Payer: Self-pay | Admitting: Ophthalmology

## 2010-08-18 ENCOUNTER — Ambulatory Visit
Admission: RE | Admit: 2010-08-18 | Discharge: 2010-08-18 | Payer: Self-pay | Source: Home / Self Care | Attending: Internal Medicine | Admitting: Internal Medicine

## 2010-08-18 ENCOUNTER — Other Ambulatory Visit: Payer: Self-pay | Admitting: Internal Medicine

## 2010-08-18 LAB — HEMOGLOBIN A1C: Hgb A1c MFr Bld: 9.1 % — ABNORMAL HIGH (ref 4.6–6.5)

## 2010-08-30 LAB — CONVERTED CEMR LAB: Blood Glucose, Fingerstick: 434

## 2010-09-01 NOTE — Assessment & Plan Note (Signed)
Summary: 2:15 F/U FROM [place] [facility]   Vital Signs:  Patient profile:   65 year old male Temp:     98.3 degrees F oral Pulse rate:   72 / minute Pulse rhythm:   regular BP sitting:   150 / 70  (left arm) Cuff size:   large  Vitals Entered By: Mervin Hack CMA [facility]) (October 09, 2009 2:17 PM) CC: hospital follow-up   History of Present Illness: Rehospitalized again with DKA Discussed the possiblity of assisted living or SNF--he refuses  Using lantus again (had briefly been changed to NPH at hospital) States he is taking 40-50 units at night and occ at lunch time discussed that it should only be once a day  Checks his sugars before meals uses 5-8 before meals but if over 300, he will take 30 -40 units of novolog because it just goes up so high  Sugar machine reviewed Highly variable--under 100 at times, then high or in 400's on machine  Has ulcer on left ankle getting follow up at wound care center  Had upper extremity DVT now on coumadin  Allergies: 1)  * Fish  Past History:  Past medical, surgical, family and social histories (including risk factors) reviewed for relevance to current acute and chronic problems.  Past Medical History: Reviewed history from 04/01/2009 and no changes required. Congestive heart failure-------EF-30% Coronary artery disease Depression Diabetes mellitus, type II with neuropathy GERD Hypothyroidism--1/07 Benign prostatic hypertrophy Renal insufficiency--creat 2.5-3.4  12/08    ESRD-dialysis started 2/10 Cerebrovascular accident, hx of  2006--R hemiparesis Hypertension Anxiety Osteoarthritis  Past Surgical History: Reviewed history from 12/31/2008 and no changes required. 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 Dad died @70 --MI Mom alive  at?89 1 sister--breast cancer CAD/HTN in family No other DM (just him)  Social History: Reviewed history from 10/15/2006 and no changes required. 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         still sedentary had constipation--had to get disimpacted in ER march 7th  Physical Exam  General:  alert.  NAD Neck:  supple and no masses.   Lungs:  normal respiratory effort and normal breath sounds.   Heart:  normal rate, regular rhythm, no murmur, and no gallop.   Abdomen:  soft and non-tender.   Extremities:  2+ edema on right  1+ on left Psych:  normally interactive, good eye contact, not anxious appearing, and not depressed appearing.     Impression & Recommendations:  Problem # 1:  DIABETES MELLITUS, TYPE II, WITH NEUROLOGICAL COMPLICATIONS (ICD-250.60) Assessment Comment Only poor control with extreme lability discussed proper dosing but he seems to have a fair grasp and it still varies greatly  His updated medication list for this problem includes:    Aspirin 325 Mg Tbec (Aspirin) .Marland Kitchen... Take one by mouth daily    Lantus 100 Unit/ml Soln (Insulin glargine) .Marland Kitchen... Take 37units subcutaneous every morning.    Novolog 100 Unit/ml Soln (Insulin aspart) .Marland KitchenMarland KitchenMarland KitchenMarland Kitchen 6 units by mouth three times a day with meals except take 4 units with dinner mon, wed and fri after dialysis and with breakfast on tuesday    Benazepril Hcl 20 Mg Tabs (Benazepril hcl) .Marland Kitchen... 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 Take one by mouth daily    Furosemide 20 Mg Tabs (Furosemide) .Marland Kitchen... 1 daily as needed for increased swelling of feet    Benazepril Hcl 20 Mg Tabs (Benazepril hcl) .Marland Kitchen... 1 daily    Metoprolol Succinate 25 Mg Xr24h-tab (Metoprolol succinate) .Marland Kitchen... 1 daily    Warfarin Sodium 5 Mg Tabs (Warfarin sodium) .Marland Kitchen... Take 1 by mouth once daily in the evening  Complete Medication List: 1)  Neurontin 300 Mg Caps (Gabapentin) .... Take 2 at bedtime 2)  Synthroid 50 Mcg Tabs (Levothyroxine sodium) .... Take one by mouth daily 3)  Aspirin 325 Mg Tbec (Aspirin) .... Take one by mouth daily 4)  Prevacid 30 Mg Cpdr (Lansoprazole) .... Take one by mouth daily 5)  Finasteride 5 Mg Tabs (Finasteride) .... Take one by mouth daily 6)  Freestyle Lite Strp (Glucose blood) .... Check 4 times daily to facilitate mulitdose insulin adjustments 7)  Lantus 100 Unit/ml Soln (Insulin glargine) .... Take 37units subcutaneous every morning. 8)  Furosemide 20 Mg Tabs (Furosemide) .Marland Kitchen.. 1 daily as needed for increased swelling of feet 9)  Novolog 100 Unit/ml Soln (Insulin aspart) .... 6 units by mouth three times a day with meals except take 4 units with dinner mon, wed and fri after dialysis and with breakfast on tuesday 10)  Oxycodone-acetaminophen 5-325 Mg Tabs (Oxycodone-acetaminophen) .Marland Kitchen.. 1 every 6 hours as needed 11)  Benazepril Hcl 20 Mg Tabs (Benazepril hcl) .Marland Kitchen.. 1 daily 12)  Amlodipine Besylate 10 Mg Tabs (Amlodipine besylate) .... Take 1 tablet by mouth once a day 13)  Simvastatin 80 Mg Tabs (Simvastatin) .... Take 1 tablet by mouth once a day 14)  Bd Insulin Syringe Ultrafine 31g X 5/16" 0.5 Ml Misc (Insulin syringe-needle u-100) .... Use as directed 15)  Fexofenadine Hcl 180 Mg Tabs (Fexofenadine hcl) .... Take 1 tablet by mouth once a day as needed 16)  Lorazepam 0.5 Mg  Tabs (Lorazepam) .Marland Kitchen.. 1 before dialysis, then 1-2 during dialysis as needed. may repeat later in day if needed.  maximum 4 daily 17)  Fluticasone Propionate 50 Mcg/act Susp (Fluticasone propionate) .... 2 sprays per nostril daily 18)  Flomax 0.4 Mg Xr24h-cap (Tamsulosin hcl) .Marland Kitchen.. 1 by mouth daily 19)  Metoprolol Succinate 25 Mg Xr24h-tab (Metoprolol succinate) .Marland Kitchen.. 1 daily 20)  Miralax Powd (Polyethylene glycol 3350) .Marland Kitchen.. 1 capful with water daily to two times a day to prevent constipation 21)  Reglan 5 Mg Tabs (Metoclopramide hcl) .... Take by mouth three times a day and at bedtime 22)  Senna 187 Mg Tabs (Senna) .... Take 1 by mouth once daily 23)  Warfarin Sodium 5 Mg Tabs (Warfarin sodium) .... Take 1 by mouth once daily in the evening 24)  Midodrine Hcl 10 Mg Tabs (Midodrine hcl) .... Take 1 by mouth every monday, wednesday and friday before dialysis  Patient Instructions: 1)  Please schedule a follow-up appointment in 1 month.   Current Allergies (reviewed today): * FISH  Appended Document: 2:15 F/U FROM [facility]  Laboratory Results   Blood Tests   Date/Time Recieved: October 09, 2009 3:31 PM  Date/Time Reported: October 09, 2009 3:31 PM      INR: 4.8   (Normal Range: 0.88-1.12   Therap INR: 2.0-3.5)      ANTICOAGULATION RECORD  NEW REGIMEN & LAB RESULTS Anticoag. Dx: Deep venous thrombosis Current INR Goal Range: 2.5-3.5 Current INR: 4.8 Current Coumadin Dose(mg): 5mg  qd Regimen: holding  Provider: Majestic Brister      Repeat testing in: 5 days(dialysis on MON) MEDICATIONS NEURONTIN 300 MG CAPS (GABAPENTIN) take 2 at bedtime SYNTHROID 50 MCG  TABS (LEVOTHYROXINE SODIUM) Take one by mouth daily ASPIRIN 325 MG  TBEC (ASPIRIN) Take one by mouth daily PREVACID 30 MG  CPDR (LANSOPRAZOLE) Take one by mouth daily FINASTERIDE 5 MG  TABS (FINASTERIDE) Take one by mouth daily FREESTYLE LITE   STRP (GLUCOSE BLOOD) check 4 times daily to facilitate mulitdose insulin  adjustments LANTUS 100 UNIT/ML  SOLN (INSULIN GLARGINE) take 37units subcutaneous every morning. FUROSEMIDE 20 MG  TABS (FUROSEMIDE) 1 daily as needed for increased swelling of feet NOVOLOG 100 UNIT/ML  SOLN (INSULIN ASPART) 6 units by mouth three times a day with meals except take 4 units with dinner Mon, Wed and Fri after dialysis and with breakfast on Tuesday OXYCODONE-ACETAMINOPHEN 5-325 MG  TABS (OXYCODONE-ACETAMINOPHEN) 1 every 6 hours as needed BENAZEPRIL HCL 20 MG  TABS (BENAZEPRIL HCL) 1 daily AMLODIPINE BESYLATE 10 MG  TABS (AMLODIPINE BESYLATE) Take 1 tablet by mouth once a day SIMVASTATIN 80 MG  TABS (SIMVASTATIN) Take 1 tablet by mouth once a day BD INSULIN SYRINGE ULTRAFINE 31G X 5/16" 0.5 ML  MISC (INSULIN SYRINGE-NEEDLE U-100) use as directed FEXOFENADINE HCL 180 MG  TABS (FEXOFENADINE HCL) Take 1 tablet by mouth once a day as needed LORAZEPAM 0.5 MG TABS (LORAZEPAM) 1 before dialysis, then 1-2 during dialysis as needed. May repeat later in day if needed.  Maximum 4 daily FLUTICASONE PROPIONATE 50 MCG/ACT SUSP (FLUTICASONE PROPIONATE) 2 sprays per nostril daily FLOMAX 0.4 MG XR24H-CAP (TAMSULOSIN HCL) 1 by mouth daily METOPROLOL SUCCINATE 25 MG XR24H-TAB (METOPROLOL SUCCINATE) 1 daily MIRALAX  POWD (POLYETHYLENE GLYCOL 3350) 1 capful with water daily to two times a day to prevent constipation REGLAN 5 MG TABS (METOCLOPRAMIDE HCL) take by mouth three times a day and at bedtime SENNA 187 MG TABS (SENNA) take 1 by mouth once daily WARFARIN SODIUM 5 MG TABS (WARFARIN SODIUM) take 1 by mouth once daily in the evening MIDODRINE HCL 10 MG TABS (MIDODRINE HCL) take 1 by mouth every Monday, Wednesday and Friday before dialysis

## 2010-09-01 NOTE — Assessment & Plan Note (Signed)
Summary: 1 M F/U DLO   Vital Signs:  Patient profile:   65 year old male Temp:     97.7 degrees F tympanic Pulse rate:   74 / minute Pulse rhythm:   regular BP sitting:   168 / 70  (left arm) Cuff size:   large  Vitals Entered By: Mervin Hack CMA [facility]) (November 13, 2009 4:21 PM) CC: 1 month follow-up   History of Present Illness: Had angioplasty on right arm fistula Now working better  [employer] caregiving and dressing change to left ankle daily seems to be doing okay No longer going to wound care center  Sugars still highly variable No sig hypoglycemic spells  "I just feel bad" over the past week Feels drained, no energy Had to limit dialysis due to shunt troubles---able to do full dialysis yesterday though  Edema worse again Breathing is okay though No chest pain  No orthostatic symptoms now doesn't stand up but no problems when sitting  only took the lasix yesterday--not taking regularly  Allergies: 1)  * Fish  Review of Systems       not sleeping well Blister on right calf also--no redness or pain  Physical Exam  General:  alert.  NAD Neck:  supple and no masses.  No increased JVP Lungs:  normal respiratory effort, no intercostal retractions, no accessory muscle use, and normal breath sounds.   Heart:  normal rate, regular rhythm, no murmur, and no gallop.   Extremities:  3+ edema bilateral calves No weeping Skin:  small open blister without inflammation --mid lateral left calf  same crusting on feet but no open lesions Psych:  normally interactive, good eye contact, and not anxious appearing.     Impression & Recommendations:  Problem # 1:  FAILURE, SYSTOLIC HEART, CHRONIC (ICD-428.22) Assessment Unchanged not exacerbated but edema is worse will have him take furosemide regularly for now   His updated medication list for this problem includes:    Aspirin 325 Mg Tbec (Aspirin) .Marland Kitchen... Take one by mouth daily    Furosemide 20 Mg Tabs (Furosemide)  .Marland Kitchen... 1 daily as needed for increased swelling of feet    Benazepril Hcl 20 Mg Tabs (Benazepril hcl) .Marland Kitchen... 1 daily    Metoprolol Succinate 25 Mg Xr24h-tab (Metoprolol succinate) .Marland Kitchen... 1 daily    Warfarin Sodium 5 Mg Tabs (Warfarin sodium) .Marland Kitchen... Take 1 by mouth once daily in the evening  Problem # 2:  HYPOTENSION (ICD-458.9) Assessment: Improved stop the midodrine since he doesn't need that Not clear he ever had true orthostasis  Problem # 3:  DIABETES MELLITUS, TYPE II, WITH NEUROLOGICAL COMPLICATIONS (ICD-250.60) Assessment: Unchanged  labile will check A1c again  His updated medication list for this problem includes:    Aspirin 325 Mg Tbec (Aspirin) .Marland Kitchen... Take one by mouth daily    Lantus 100 Unit/ml Soln (Insulin glargine) .Marland Kitchen... Take 37units subcutaneous every morning.    Novolog 100 Unit/ml Soln (Insulin aspart) .Marland KitchenMarland KitchenMarland KitchenMarland Kitchen 6 units by mouth three times a day with meals except take 4 units with dinner mon, wed and fri after dialysis and with breakfast on tuesday    Benazepril Hcl 20 Mg Tabs (Benazepril hcl) .Marland Kitchen... 1 daily  Orders: TLB-TSH (Thyroid Stimulating Hormone) (84443-TSH) Venipuncture (10272) TLB-T4 (Thyrox), Free (84439-FT4R) TLB-A1C / Hgb A1C (Glycohemoglobin) (83036-A1C)  Problem # 4:  ANXIETY (ICD-300.00) Assessment: Unchanged mood up and down some but generally stable  His updated medication list for this problem includes:    Lorazepam 0.5 Mg Tabs (Lorazepam) .Marland KitchenMarland KitchenMarland KitchenMarland Kitchen  1 before dialysis, then 1-2 during dialysis as needed. may repeat later in day if needed.  maximum 4 daily  Complete Medication List: 1)  Neurontin 300 Mg Caps (Gabapentin) .... Take 2 at bedtime 2)  Synthroid 50 Mcg Tabs (Levothyroxine sodium) .... Take one by mouth daily 3)  Aspirin 325 Mg Tbec (Aspirin) .... Take one by mouth daily 4)  Prevacid 30 Mg Cpdr (Lansoprazole) .... Take one by mouth daily 5)  Finasteride 5 Mg Tabs (Finasteride) .... Take one by mouth daily 6)  Freestyle Lite Strp (Glucose  blood) .... Check 4 times daily to facilitate mulitdose insulin adjustments 7)  Lantus 100 Unit/ml Soln (Insulin glargine) .... Take 37units subcutaneous every morning. 8)  Furosemide 20 Mg Tabs (Furosemide) .Marland Kitchen.. 1 daily as needed for increased swelling of feet 9)  Novolog 100 Unit/ml Soln (Insulin aspart) .... 6 units by mouth three times a day with meals except take 4 units with dinner mon, wed and fri after dialysis and with breakfast on tuesday 10)  Oxycodone-acetaminophen 5-325 Mg Tabs (Oxycodone-acetaminophen) .Marland Kitchen.. 1 every 6 hours as needed 11)  Benazepril Hcl 20 Mg Tabs (Benazepril hcl) .Marland Kitchen.. 1 daily 12)  Amlodipine Besylate 10 Mg Tabs (Amlodipine besylate) .... Take 1 tablet by mouth once a day 13)  Simvastatin 80 Mg Tabs (Simvastatin) .... Take 1 tablet by mouth once a day 14)  Bd Insulin Syringe Ultrafine 31g X 5/16" 0.5 Ml Misc (Insulin syringe-needle u-100) .... Use as directed 15)  Fexofenadine Hcl 180 Mg Tabs (Fexofenadine hcl) .... Take 1 tablet by mouth once a day as needed 16)  Lorazepam 0.5 Mg Tabs (Lorazepam) .Marland Kitchen.. 1 before dialysis, then 1-2 during dialysis as needed. may repeat later in day if needed.  maximum 4 daily 17)  Fluticasone Propionate 50 Mcg/act Susp (Fluticasone propionate) .... 2 sprays per nostril daily 18)  Flomax 0.4 Mg Xr24h-cap (Tamsulosin hcl) .Marland Kitchen.. 1 by mouth daily 19)  Metoprolol Succinate 25 Mg Xr24h-tab (Metoprolol succinate) .Marland Kitchen.. 1 daily 20)  Miralax Powd (Polyethylene glycol 3350) .Marland Kitchen.. 1 capful with water daily to two times a day to prevent constipation 21)  Reglan 5 Mg Tabs (Metoclopramide hcl) .... Take by mouth three times a day and at bedtime 22)  Senna 187 Mg Tabs (Senna) .... Take 1 by mouth once daily 23)  Warfarin Sodium 5 Mg Tabs (Warfarin sodium) .... Take 1 by mouth once daily in the evening  Other Orders: TLB-Lipid Panel (80061-LIPID) TLB-Hepatic/Liver Function Pnl (80076-HEPATIC)  Patient Instructions: 1)  Please schedule a follow-up  appointment in 1 month.  2)  Please take the lasix (furosemide) regularly till legs are better 3)  Stop the midodrine  Current Allergies (reviewed today): * FISH

## 2010-09-01 NOTE — Assessment & Plan Note (Signed)
Summary: F/U [facility]  D/C 08/26/09/[facility]   Vital Signs:  Patient profile:   65 year old male O2 Sat:      96 % on Room air Temp:     97.8 degrees F oral Pulse rate:   82 / minute BP sitting:   138 / 60  (left arm) Cuff size:   large  Vitals Entered By: Mervin Hack CMA (AAMA) (February  1, Dec 17, 2009 10:30 AM)  O2 Flow:  Room air CC: follow-up visit   History of Present Illness: Recent hospitalizations for chest pain Felt to be non cardiac Chest and shoulders "hurt terrible" Using pain pills but causing bad constipation started ex-lax  Infected dialysis catheter removed and replaced On ongoing antibiotic for another 4-5 days No fever  Checks sugars 4-5 times per day adjusted insulin at meals  Still with urinary flow Voids okay with finasteride  No heart trouble No SOB--but is very sedentary  Uses wheelchair but able to transfer without assistance  Has been depressed and lost his (little bit of) faith after wife died in 12-17-1997 Only joy is seeing his sons and grandchildren   Allergies: 1)  * Fish  Past History:  Past medical, surgical, family and social histories (including risk factors) reviewed for relevance to current acute and chronic problems.  Past Medical History: Reviewed history from 04/01/2009 and no changes required. Congestive heart failure-------EF-30% Coronary artery disease Depression Diabetes mellitus, type II with neuropathy GERD Hypothyroidism--1/07 Benign prostatic hypertrophy Renal insufficiency--creat 2.5-3.4  12/08    ESRD-dialysis started 2/10 Cerebrovascular accident, hx of  2006--R hemiparesis Hypertension Anxiety Osteoarthritis  Past Surgical History: Reviewed history from 12/31/2008 and no changes required. 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 Dad died  @70 --MI Mom alive at?89 1 sister--breast cancer CAD/HTN in family No other DM (just him)  Social History: Reviewed history from 10/15/2006 and no changes required. 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  NAD Neck:  supple and no masses.   Lungs:  normal respiratory effort and normal breath sounds.   Heart:  normal rate, regular rhythm, no murmur, and no gallop.  Occ skip beats Pulses:  not palpable Extremities:  3+ edema on right  2+ on left  Skin:  callous and crusting on feet but no ulcers Psych:  normally interactive, not anxious appearing, and depressed affect.     Impression & Recommendations:  Problem # 1:  DIABETES MELLITUS, TYPE II, WITH NEUROLOGICAL COMPLICATIONS (ICD-250.60) Assessment Unchanged thinks he gets this checked at dialysis he will ask for a copy to be sent to him] told him not to adjust his lantus, stay at 60  His updated medication list for this problem includes:    Aspirin 325 Mg Tbec (Aspirin) .Marland Kitchen... Take one by mouth daily    Lantus 100 Unit/ml Soln (Insulin glargine) .Marland KitchenMarland KitchenMarland KitchenMarland Kitchen 60 units daily in the evening    Novolog 100 Unit/ml Soln (Insulin aspart) .Marland Kitchen... 20-30  units three times a day before meals    Benazepril Hcl 20 Mg Tabs (Benazepril hcl) .Marland Kitchen... 1 daily  Labs Reviewed: Creat: 2.9 (06/05/2008)    Reviewed HgBA1c results: 8.3 (02/11/2009)  10.5 (06/05/2008)  Problem # 2:  OSTEOARTHRITIS (ICD-715.90)  Assessment: Deteriorated more pain secondary constipation discussed miralax  His updated medication list for this problem includes:    Aspirin 325 Mg Tbec (Aspirin) .Marland Kitchen... Take one by mouth daily    Oxycodone-acetaminophen 5-325 Mg Tabs (Oxycodone-acetaminophen) .Marland Kitchen... 1 every 6 hours as needed  Problem # 3:  FAILURE,  SYSTOLIC HEART, CHRONIC (ICD-428.22) Assessment: Unchanged same fluid overload in legs chest okay  His updated medication list for this problem includes:    Aspirin 325 Mg Tbec (Aspirin) .Marland Kitchen... Take one by mouth daily    Furosemide 20 Mg Tabs (Furosemide) .Marland Kitchen... 1 daily as needed for increased swelling of feet    Benazepril Hcl 20 Mg Tabs (Benazepril hcl) .Marland Kitchen... 1 daily    Metoprolol Succinate 25 Mg Xr24h-tab (Metoprolol succinate) .Marland Kitchen... 1 daily  Problem # 4:  END STAGE RENAL DISEASE (ICD-585.6) Assessment: Comment Only on dialysis  Problem # 5:  HYPERLIPIDEMIA (ICD-272.4) Assessment: Unchanged good control will ask them to draw with labs at dialysis  His updated medication list for this problem includes:    Simvastatin 80 Mg Tabs (Simvastatin) .Marland Kitchen... Take 1 tablet by mouth once a day  Labs Reviewed: SGOT: 27 (02/11/2009)   SGPT: 15 (02/11/2009)   HDL:39.70 (02/11/2009), 54.4 (06/05/2008)  LDL:48 (02/11/2009), DEL (04/54/0981)  Chol:111 (02/11/2009), 211 (06/05/2008)  Trig:117.0 (02/11/2009), 302 (06/05/2008)  Problem # 6:  BENIGN PROSTATIC HYPERTROPHY (ICD-600.00) Assessment: Unchanged voids okay  Problem # 7:  DEPRESSION (ICD-311) Assessment: Deteriorated hasn't had success with meds in past given his despair, I am not surprised about this  His updated medication list for this problem includes:    Lorazepam 0.5 Mg Tabs (Lorazepam) .Marland Kitchen... 1 before dialysis, then 1-2 during dialysis as needed. may repeat later in day if needed.  maximum 4 daily  Complete Medication List: 1)  Neurontin 300 Mg Caps (Gabapentin) .... Take 2 at bedtime 2)  Synthroid 50 Mcg Tabs (Levothyroxine sodium) .... Take one by mouth daily 3)  Aspirin 325 Mg Tbec (Aspirin) .... Take one by mouth daily 4)  Prevacid 30 Mg Cpdr (Lansoprazole) .... Take one by mouth daily 5)  Finasteride 5 Mg Tabs (Finasteride) .... Take one by mouth daily 6)  Freestyle Lite Strp (Glucose blood) .... Check 4 times daily to  facilitate mulitdose insulin adjustments 7)  Lantus 100 Unit/ml Soln (Insulin glargine) .... 60 units daily in the evening 8)  Furosemide 20 Mg Tabs (Furosemide) .Marland Kitchen.. 1 daily as needed for increased swelling of feet 9)  Novolog 100 Unit/ml Soln (Insulin aspart) .... 20-30  units three times a day before meals 10)  Oxycodone-acetaminophen 5-325 Mg Tabs (Oxycodone-acetaminophen) .Marland Kitchen.. 1 every 6 hours as needed 11)  Benazepril Hcl 20 Mg Tabs (Benazepril hcl) .Marland Kitchen.. 1 daily 12)  Amlodipine Besylate 10 Mg Tabs (Amlodipine besylate) .... Take 1 tablet by mouth once a day 13)  Simvastatin 80 Mg Tabs (Simvastatin) .... Take 1 tablet by mouth once a day 14)  Bd Insulin Syringe Ultrafine 31g X 5/16" 0.5 Ml Misc (Insulin syringe-needle u-100) .... Use as directed 15)  Fexofenadine Hcl 180 Mg Tabs (Fexofenadine hcl) .... Take 1 tablet by mouth once a day as needed 16)  Lorazepam 0.5 Mg Tabs (Lorazepam) .Marland Kitchen.. 1 before dialysis, then 1-2 during dialysis as needed. may repeat later in day if needed.  maximum 4 daily 17)  Fluticasone Propionate 50 Mcg/act Susp (Fluticasone propionate) .... 2 sprays per nostril daily 18)  Flomax 0.4 Mg Xr24h-cap (Tamsulosin hcl) .Marland Kitchen.. 1 by mouth daily 19)  Metoprolol Succinate 25 Mg  Xr24h-tab (Metoprolol succinate) .Marland Kitchen.. 1 daily 20)  Meclizine Hcl 25 Mg Tabs (Meclizine hcl) .Marland Kitchen.. 1 tab three times a day for dizziness/vertigo. may wean off when resolved 21)  Miralax Powd (Polyethylene glycol 3350) .Marland Kitchen.. 1 capful with water daily to two times a day to prevent constipation  Patient Instructions: 1)  Please take miralax 1 capful daily with water to prevent constipation. May need to take two times a day to keep stools regular 2)  Please schedule a follow-up appointment in 4 months . Cancel any other appts  Current Allergies (reviewed today): * FISH

## 2010-09-01 NOTE — Letter (Signed)
Summary: [facility]  [facility]   Imported By: Maryln Gottron 10/28/2009 11:06:17  _____________________________________________________________________  External Attachment:    Type:   Image     Comment:   External Document  Appended Document: [facility] catheter related DVT---will need catheter removed  and graft is patent

## 2010-09-01 NOTE — Progress Notes (Signed)
Summary: refill request for finasteride  Phone Note Refill Request Message from:  Fax from Pharmacy  Refills Requested: Medication #1:  FINASTERIDE 5 MG  TABS Take one by mouth daily   Last Refilled: 12/22/2009 Faxed request from [facility], [phone].  Initial call taken by: Lowella Petties CMA,  February 11, 2010 11:15 AM    Prescriptions: FINASTERIDE 5 MG  TABS (FINASTERIDE) Take one by mouth daily  #30 x 12   Entered and Authorized by:   Ruthe Mannan MD   Signed by:   Ruthe Mannan MD on 02/11/2010   Method used:   Electronically to        [facility] (retail)       [address]       [place], [place]  [address]       Ph: [phone]       Fax: [phone]   RxID:   [id number]

## 2010-09-01 NOTE — Progress Notes (Signed)
Summary: refill request for oxycodone  Phone Note Refill Request Call back at Home Phone [phone] Message from:  Patient  Refills Requested: Medication #1:  OXYCODONE-ACETAMINOPHEN 5-325 MG  TABS 1 every 6 hours as needed Phoned request from pt, please call when ready.  Initial call taken by: Lowella Petties CMA,  September 10, 2009 10:03 AM  Follow-up for Phone Call        Rx written Follow-up by: Cindee Salt MD,  September 10, 2009 10:04 AM  Additional Follow-up for Phone Call Additional follow up Details #1::        have tried calling number several times, there is no answering service, number sounds like a fax machine. I will leave rx at front desk for pick up and maybe patient will call back. Additional Follow-up by: Mervin Hack CMA Duncan Dull),  September 10, 2009 10:25 AM    New/Updated Medications: OXYCODONE-ACETAMINOPHEN 5-325 MG  TABS (OXYCODONE-ACETAMINOPHEN) 1 every 6 hours as needed Prescriptions: OXYCODONE-ACETAMINOPHEN 5-325 MG  TABS (OXYCODONE-ACETAMINOPHEN) 1 every 6 hours as needed  #60 x 0   Entered and Authorized by:   Cindee Salt MD   Signed by:   Cindee Salt MD on 09/10/2009   Method used:   Print then Give to Patient   RxID:   [id number]

## 2010-09-01 NOTE — Letter (Signed)
Summary: Instructions/[facility]  [facility]   Imported By: Lanelle Bal 10/08/2009 12:40:52  _____________________________________________________________________  External Attachment:    Type:   Image     Comment:   External Document

## 2010-09-01 NOTE — Op Note (Signed)
Summary: Catheter Removal/[facility]  Catheter Removal/[facility]   Imported By: Lanelle Bal 08/30/2009 10:47:07  _____________________________________________________________________  External Attachment:    Type:   Image     Comment:   External Document

## 2010-09-01 NOTE — Progress Notes (Signed)
  Phone Note Outgoing Call   Call placed by: Cindee Salt MD,  June 08, 2010 5:09 PM Summary of Call: admitted to [facility] Had hypoglycemic reaction GI symptoms---viral infection Can't get rid of his diarrhea--so not feeling great His code word is Valentina Gu Initial call taken by: Cindee Salt MD,  June 08, 2010 5:10 PM  Follow-up for Phone Call        still having stomach trouble and diarrhea Think it is viral infection  may have to be there for a while still Cindee Salt MD  June 11, 2010 1:03 PM   Still in hospital phone answered by someone else--he was too tired to talk to me will recheck in him later in the week Cindee Salt MD  June 15, 2010 2:01 PM      Appended Document:     Phone Note Outgoing Call   Call placed by: Cindee Salt MD,  June 18, 2010 4:10 PM Summary of Call: checked with hospital  Now discharged  spoke to him still weak He will call if he needs visit before the scheduled January appt Initial call taken by: Cindee Salt MD,  June 19, 2010 8:02 AM

## 2010-09-01 NOTE — Consult Note (Signed)
[facility]   Imported By: Harlon Flor 08/26/2009 09:53:43  _____________________________________________________________________  External Attachment:    Type:   Image     Comment:   External Document  Appended Document: [facility] Non cardiac chest pain consult by Dr Jaquita Rector assume cardiac outpatient care

## 2010-09-01 NOTE — Letter (Signed)
Summary: [facility]  [facility]   Imported By: Lanelle Bal 12/15/2009 11:26:19  _____________________________________________________________________  External Attachment:    Type:   Image     Comment:   External Document  Appended Document: [facility] Right A-V fistula is functioning UNNA boots put on for sig edema with foot ulcer arterial studies normal though

## 2010-09-01 NOTE — Progress Notes (Signed)
Summary: Insulin  Phone Note From Pharmacy   Caller: [facility] [phone] Call For: Dr/ Alphonsus Sias  Summary of Call: call from pharmacy questioning pt's rx's for insulin, pt very upset that his rx is not ready. They have on file Novolog (pt got from [facility]) he  picked up rx on 11/25/2009 and back today for another rx, pharmacist states this rx should last 45days if he is taking it as follows :6 units by mouth three times a day with meals except take 4 units with dinner Mon, Wed and Fri after dialysis and with breakfast on Tuesday. He also has rx for Lantus ( from [facility]) that he wants refills for. Pharmacy also have Humlin-N given to pt on 11/20/2009 by Dr. Leeroy Cha @ [facility]. Pt is at pharmacy now. Initial call taken by: Mervin Hack CMA Duncan Dull),  Dec 11, 2009 4:21 PM  Follow-up for Phone Call        Please find out from patient how much he is taking. He may have to adjust up if his sugars are running high I don't want him to be out of his insulin Cindee Salt MD  Dec 11, 2009 4:23 PM   spoke with pt he is taking: Novolog- 40units a.m, 30 units @ lunch, 40units in afternoon and 30units at bedtime.  Lantus 40units in the afternoon. Pt states his sugars have been running high.  he also states he is not taking the insulin that Dr. Leeroy Cha gave him at [facility]. DeShannon Smith CMA Duncan Dull)  Dec 11, 2009 5:43 PM                   Please update med list and confirm doses with pharmacist Follow-up by: Cindee Salt MD,  Dec 11, 2009 5:59 PM  Additional Follow-up for Phone Call Additional follow up Details #1::        spoke with pharmacy and Dr. Alphonsus Sias, pt instructions are correct, pt will adjust insulin as per the hospital. Pt's blood sugars are running high. Rx sent to pharmacy.    New/Updated Medications: LANTUS 100 UNIT/ML  SOLN (INSULIN GLARGINE) take 40units subcutaneous every morning. NOVOLOG 100 UNIT/ML  SOLN (INSULIN ASPART) 40units a.m, 30 units @ lunch, 40units in  afternoon and 30units at bedtime Prescriptions: NOVOLOG 100 UNIT/ML  SOLN (INSULIN ASPART) 40units a.m, 30 units @ lunch, 40units in afternoon and 30units at bedtime  #4 x 12   Entered by:   Mervin Hack CMA (AAMA)   Authorized by:   Cindee Salt MD   Signed by:   Mervin Hack CMA (AAMA) on 12/12/2009   Method used:   Electronically to        [facility] (retail)       [address]       [place], [place]  [address]       Ph: [phone]       Fax: [phone]   RxID:   [id number] LANTUS 100 UNIT/ML  SOLN (INSULIN GLARGINE) take 40units subcutaneous every morning.  #1 x 12   Entered by:   Mervin Hack CMA (AAMA)   Authorized by:   Cindee Salt MD   Signed by:   Mervin Hack CMA (AAMA) on 12/12/2009   Method used:   Electronically to        [facility] (retail)       [address]       [place]  [place], [place]  [address]       Ph: [phone]       Fax: [phone]   RxID:   [id number]

## 2010-09-01 NOTE — Progress Notes (Signed)
Summary: Oxycodone  Phone Note Refill Request Call back at [phone] Message from:  Patient on September 17, 2009 9:04 AM  Refills Requested: Medication #1:  OXYCODONE-ACETAMINOPHEN 5-325 MG  TABS 1 every 6 hours as needed Please call patient when ready for pick up.    Method Requested: Pick up at Office Initial call taken by: Melody Comas,  September 17, 2009 9:05 AM  Follow-up for Phone Call        Just got Rx less than a week ago Did he pick up that Rx? Follow-up by: Cindee Salt MD,  September 17, 2009 10:05 AM  Additional Follow-up for Phone Call Additional follow up Details #1::        Called number listed and it sounds like a fax machine, I see where Geraldine Contras tried calling the patient last week and was unsuccessful.  When patient calls back please get an updated phone number.  Rx is at the front desk for pick up.  Linde Gillis CMA Duncan Dull)  September 17, 2009 11:11 AM   Spoke with patient and advised results. Patient will pick up rx today. Additional Follow-up by: Mervin Hack CMA Duncan Dull),  September 18, 2009 8:41 AM

## 2010-09-01 NOTE — Letter (Signed)
[facility]  [facility]   Imported By: Lanelle Bal 06/24/2010 10:09:00  _____________________________________________________________________  External Attachment:    Type:   Image     Comment:   External Document  Appended Document: [facility] C dif colitis with secondary SIRS

## 2010-09-01 NOTE — Letter (Signed)
Summary: [facility]  [facility]   Imported By: Maryln Gottron 06/29/2010 12:52:38  _____________________________________________________________________  External Attachment:    Type:   Image     Comment:   External Document

## 2010-09-01 NOTE — Op Note (Signed)
Summary: Dr[facility]Gregory Schnier,[facility]  Dr[facility]ory [facility]   Imported By: Beau Fanny 11/12/2009 10:01:52  _____________________________________________________________________  External Attachment:    Type:   Image     Comment:   External Document  Appended Document: Dr.Gregory Schnier,[facility] angioplasty to right arm fistula

## 2010-09-01 NOTE — Op Note (Signed)
Summary: Catheter Insertion/[facility]  [facility]   Imported By: Lanelle Bal 09/05/2009 09:39:03  _____________________________________________________________________  External Attachment:    Type:   Image     Comment:   External Document

## 2010-09-01 NOTE — Letter (Signed)
Summary: [facility]   Imported By: Lanelle Bal 08/04/2009 12:25:20  _____________________________________________________________________  External Attachment:    Type:   Image     Comment:   External Document

## 2010-09-01 NOTE — Assessment & Plan Note (Signed)
Summary: 4 m f/u dlo   Vital Signs:  Patient profile:   65 year old male O2 Sat:      92 % on Room air Temp:     97.6 degrees F oral Pulse rate:   66 / minute Pulse rhythm:   regular BP sitting:   122 / 60  (left arm) Cuff size:   large  Vitals Entered By: Mervin Hack CMA Duncan Dull) (April 14, 2010 10:21 AM)  O2 Flow:  Room air CC: 4 month follow-up/ rash on chest   History of Present Illness: Here with friend Peyton Najjar as usual  Doing fairly well Dialysis doing fine through right arm fistula seems to control his edema and fluid status well (gets 2-3liters off per session) Does have hypotension in dialysis with systolic down to 95--GLO limits his dialysis  Still gets low sugars at times checks about 5 times per day and adjusts insulin usually 150-250---  occ 400 in the morning  Voids okay still with normal urine output does okay with meds  No chest pain No SOB  Notes some bumps on his chest present for 3-4 months No pain or itching  still with UNNA boots sees vascular surgeon regularly---circulation okay  Has aide every morning for 2.5 hours then briefly in evenings   Allergies: 1)  * Fish  Past History:  Past medical, surgical, family and social histories (including risk factors) reviewed for relevance to current acute and chronic problems.  Past Medical History: Reviewed history from 04/01/2009 and no changes required. Congestive heart failure-------EF-30% Coronary artery disease Depression Diabetes mellitus, type II with neuropathy GERD Hypothyroidism--1/07 Benign prostatic hypertrophy Renal insufficiency--creat 2.5-3.4  12/08    ESRD-dialysis started 2/10 Cerebrovascular accident, hx of  2006--R hemiparesis Hypertension Anxiety Osteoarthritis  Past Surgical History: Reviewed history from 12/31/2008 and no changes required. 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 Dad died @70 --MI Mom alive at?89 1 sister--breast cancer CAD/HTN in family No other DM (just him)  Social History: Reviewed history from 10/15/2006 and no changes required. Retired/disabled--[employer] Former Smoker--quit 12/02 Drug use-no widowed--2 sons  Review of Systems       eating well sleep is sporadic---on and off and sleeps in recliner Mood has been good--no depression  Physical Exam  General:  alert.  NAD Extremities:  edema in feet ---UNNA boots on Psych:  normally interactive, good eye contact, not anxious appearing, and not depressed appearing.   Actually upbeat and talkative  Diabetes Management Exam:    Foot Exam (with socks and/or shoes not present):       Sensory-Pinprick/Light touch:          Left medial foot (L-4): diminished          Left dorsal foot (L-5): diminished          Left lateral foot (S-1): diminished          Right medial foot (L-4): diminished          Right dorsal foot (L-5): diminished          Right lateral foot (S-1): diminished       Inspection:          Left foot: abnormal             Comments: UNNA boots  edematous toes          Right foot: abnormal  Comments: UNNA boots on edematous toes       Nails:          Left foot: fungal infection          Right foot: fungal infection   Impression & Recommendations:  Problem # 1:  FAILURE, SYSTOLIC HEART, CHRONIC (ICD-428.22) Assessment Unchanged seems to finally have neutral fluid status on dialysis breathing and status is stable  His updated medication list for this problem includes:    Furosemide 20 Mg Tabs (Furosemide) .Marland Kitchen... 1 daily as needed for increased swelling of feet    Benazepril Hcl 20 Mg Tabs (Benazepril hcl) .Marland Kitchen... 1 daily    Metoprolol Succinate 25 Mg Xr24h-tab (Metoprolol succinate) .Marland Kitchen... 1 daily    Aspirin 325 Mg Tbec (Aspirin) .Marland Kitchen...  Take one by mouth daily  Problem # 2:  DIABETES MELLITUS, TYPE II, WITH NEUROLOGICAL COMPLICATIONS (ICD-250.60) Assessment: Comment Only  very labile still but no serious hypoglycemic reactions will recheck  His updated medication list for this problem includes:    Lantus 100 Unit/ml Soln (Insulin glargine) .Marland Kitchen... Take 40units subcutaneous every morning.    Novolog 100 Unit/ml Soln (Insulin aspart) .Marland KitchenMarland KitchenMarland KitchenMarland Kitchen 40units a.m, 30 units @ lunch, 40units in afternoon and 30units at bedtime    Benazepril Hcl 20 Mg Tabs (Benazepril hcl) .Marland Kitchen... 1 daily    Aspirin 325 Mg Tbec (Aspirin) .Marland Kitchen... Take one by mouth daily  Labs Reviewed: Creat: 2.9 (06/05/2008)    Reviewed HgBA1c results: 8.3 (11/13/2009)  8.3 (02/11/2009)  Orders: TLB-TSH (Thyroid Stimulating Hormone) (84443-TSH) Venipuncture (95638) TLB-CBC Platelet - w/Differential (85025-CBCD) TLB-A1C / Hgb A1C (Glycohemoglobin) (83036-A1C)  Problem # 3:  HYPERTENSION (ICD-401.9) Assessment: Unchanged good control no changes needed  His updated medication list for this problem includes:    Furosemide 20 Mg Tabs (Furosemide) .Marland Kitchen... 1 daily as needed for increased swelling of feet    Benazepril Hcl 20 Mg Tabs (Benazepril hcl) .Marland Kitchen... 1 daily    Amlodipine Besylate 10 Mg Tabs (Amlodipine besylate) .Marland Kitchen... Take 1 tablet by mouth once a day    Metoprolol Succinate 25 Mg Xr24h-tab (Metoprolol succinate) .Marland Kitchen... 1 daily  BP today: 122/60 Prior BP: 130/80 (01/22/2010)  Labs Reviewed: K+: 5.5 (06/05/2008) Creat: : 2.9 (06/05/2008)   Chol: 158 (11/13/2009)   HDL: 57.70 (11/13/2009)   LDL: 72 (11/13/2009)   TG: 141.0 (11/13/2009)  Problem # 4:  HYPERLIPIDEMIA (ICD-272.4) Assessment: Unchanged good control no changes needed  His updated medication list for this problem includes:    Simvastatin 80 Mg Tabs (Simvastatin) .Marland Kitchen... Take 1 tablet by mouth once a day  Labs Reviewed: SGOT: 17 (11/13/2009)   SGPT: 11 (11/13/2009)   HDL:57.70 (11/13/2009), 39.70  (02/11/2009)  LDL:72 (11/13/2009), 48 (02/11/2009)  Chol:158 (11/13/2009), 111 (02/11/2009)  Trig:141.0 (11/13/2009), 117.0 (02/11/2009)  Problem # 5:  DEPRESSION (ICD-311) Assessment: Improved mood is much better lately  His updated medication list for this problem includes:    Lorazepam 0.5 Mg Tabs (Lorazepam) .Marland Kitchen... 1 before dialysis, then 1-2 during dialysis as needed. may repeat later in day if needed.  maximum 4 daily  Problem # 6:  END STAGE RENAL DISEASE (ICD-585.6) Assessment: Unchanged doing well on dialysis  Problem # 7:  BENIGN PROSTATIC HYPERTROPHY (ICD-600.00) Assessment: Comment Only will try off the tamsulosin--esp since his BP drops during dialysis  Complete Medication List: 1)  Neurontin 300 Mg Caps (Gabapentin) .... Take 2 at bedtime 2)  Synthroid 50 Mcg Tabs (Levothyroxine sodium) .... Take one by mouth daily 3)  Prevacid 30 Mg Cpdr (  Lansoprazole) .... Take one by mouth daily 4)  Finasteride 5 Mg Tabs (Finasteride) .... Take one by mouth daily 5)  Lantus 100 Unit/ml Soln (Insulin glargine) .... Take 40units subcutaneous every morning. 6)  Furosemide 20 Mg Tabs (Furosemide) .Marland Kitchen.. 1 daily as needed for increased swelling of feet 7)  Novolog 100 Unit/ml Soln (Insulin aspart) .... 40units a.m, 30 units @ lunch, 40units in afternoon and 30units at bedtime 8)  Benazepril Hcl 20 Mg Tabs (Benazepril hcl) .Marland Kitchen.. 1 daily 9)  Amlodipine Besylate 10 Mg Tabs (Amlodipine besylate) .... Take 1 tablet by mouth once a day 10)  Simvastatin 80 Mg Tabs (Simvastatin) .... Take 1 tablet by mouth once a day 11)  Lorazepam 0.5 Mg Tabs (Lorazepam) .Marland Kitchen.. 1 before dialysis, then 1-2 during dialysis as needed. may repeat later in day if needed.  maximum 4 daily 12)  Flomax 0.4 Mg Xr24h-cap (Tamsulosin hcl) .Marland Kitchen.. 1 by mouth daily 13)  Metoprolol Succinate 25 Mg Xr24h-tab (Metoprolol succinate) .Marland Kitchen.. 1 daily 14)  Miralax Powd (Polyethylene glycol 3350) .Marland Kitchen.. 1 capful with water daily to two times a day  to prevent constipation 15)  Reglan 5 Mg Tabs (Metoclopramide hcl) .... Take by mouth three times a day and at bedtime 16)  Senna 187 Mg Tabs (Senna) .... Take 1 by mouth once daily 17)  Aspirin 325 Mg Tbec (Aspirin) .... Take one by mouth daily 18)  Freestyle Lite Strp (Glucose blood) .... Check 4 times daily to facilitate mulitdose insulin adjustments 19)  Bd Insulin Syringe Ultrafine 31g X 5/16" 0.5 Ml Misc (Insulin syringe-needle u-100) .... Use as directed  Patient Instructions: 1)  Please try off your tamsulosin. Okay to restart if having trouble passing urine 2)  Please schedule a follow-up appointment in 4 months .   Current Allergies (reviewed today): * FISH

## 2010-09-01 NOTE — Letter (Signed)
Summary: No show/[facility]  No show/[facility]   Imported By: Lester [place] Rock 01/17/2010 11:22:28  _____________________________________________________________________  External Attachment:    Type:   Image     Comment:   External Document  Appended Document: No show/[facility]  no show for appt

## 2010-09-01 NOTE — Letter (Signed)
[facility]   Imported By: Beau Fanny 10/08/2009 16:29:21  _____________________________________________________________________  External Attachment:    Type:   Image     Comment:   External Document

## 2010-09-01 NOTE — Letter (Signed)
Summary: [facility]   Imported By: Lanelle Bal 09/04/2009 09:09:49  _____________________________________________________________________  External Attachment:    Type:   Image     Comment:   External Document

## 2010-09-01 NOTE — Assessment & Plan Note (Signed)
Summary: follow up/ alc   Vital Signs:  Patient profile:   65 year old male Temp:     98.0 degrees F oral Pulse rate:   84 / minute Pulse rhythm:   regular BP sitting:   118 / 60  (left arm) Cuff size:   large  Vitals Entered By: Mervin Hack CMA Duncan Dull) (January 06, 2010 10:46 AM) CC: follow-up visit   History of Present Illness: Doing "pretty good"  dialysis has been better through fistula more effective and he feels better  Off coumadin had catheter related DVT and now it was removed has done okay with this  diabetes has been okay checks before each meal and at bedtime--occ extra if doesn't feel right adjusts insulin regularly Occ as low as 30-40---felt "drunk"  Feels his edema is better off the coumadin getting UNNA boots weekly and that has helped  breathing is good no chest pain  No sig issues with arthritis pain now  Allergies: 1)  * Fish  Past History:  Past medical, surgical, family and social histories (including risk factors) reviewed for relevance to current acute and chronic problems.  Past Medical History: Reviewed history from 04/01/2009 and no changes required. Congestive heart failure-------EF-30% Coronary artery disease Depression Diabetes mellitus, type II with neuropathy GERD Hypothyroidism--1/07 Benign prostatic hypertrophy Renal insufficiency--creat 2.5-3.4  12/08    ESRD-dialysis started 2/10 Cerebrovascular accident, hx of  2006--R hemiparesis Hypertension Anxiety Osteoarthritis  Past Surgical History: Reviewed history from 12/31/2008 and no changes required. 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 Dad died @70 --MI Mom alive at?89 1 sister--breast cancer CAD/HTN in family No other DM (just him)  Social History: Reviewed history from 10/15/2006 and  no changes required. 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  Looks well Neck:  supple, no masses, and no thyromegaly.   Lungs:  normal respiratory effort, no intercostal retractions, no accessory muscle use, and normal breath sounds.   Heart:  normal rate, regular rhythm, no murmur, and no gallop.   Abdomen:  soft and non-tender.   Extremities:  1-2+ edema in UNNA boots Psych:  normally interactive, good eye contact, not anxious appearing, and not depressed appearing.     Impression & Recommendations:  Problem # 1:  FAILURE, SYSTOLIC HEART, CHRONIC (ICD-428.22) Assessment Improved less edema breathing is better  The following medications were removed from the medication list:    Warfarin Sodium 5 Mg Tabs (Warfarin sodium) .Marland Kitchen... Take 1 by mouth once daily in the evening His updated medication list for this problem includes:    Furosemide 20 Mg Tabs (Furosemide) .Marland Kitchen... 1 daily as needed for increased swelling of feet    Benazepril Hcl 20 Mg Tabs (Benazepril hcl) .Marland Kitchen... 1 daily    Metoprolol Succinate 25 Mg Xr24h-tab (Metoprolol succinate) .Marland Kitchen... 1 daily    Aspirin 325 Mg Tbec (Aspirin) .Marland Kitchen... Take one by mouth daily  Problem # 2:  DIABETES MELLITUS, TYPE II, WITH NEUROLOGICAL COMPLICATIONS (ICD-250.60) Assessment: Comment Only  very labile goal A1c <9% due to severe risk of hypoglycemia  His updated medication list for this problem includes:    Lantus 100 Unit/ml Soln (Insulin glargine) .Marland Kitchen... Take 40units subcutaneous every morning.    Novolog 100 Unit/ml Soln (Insulin aspart) .Marland KitchenMarland KitchenMarland KitchenMarland Kitchen 40units  a.m, 30 units @ lunch, 40units in afternoon and 30units at bedtime    Benazepril Hcl 20 Mg Tabs (Benazepril hcl) .Marland Kitchen... 1 daily    Aspirin 325 Mg Tbec (Aspirin) .Marland Kitchen... Take one by mouth daily  Labs Reviewed: Creat: 2.9 (06/05/2008)      Reviewed HgBA1c results: 8.3 (11/13/2009)  8.3 (02/11/2009)  Problem # 3:  END STAGE RENAL DISEASE (ICD-585.6) Assessment: Improved dialysis has been better with fistula  Problem # 4:  OSTEOARTHRITIS (ICD-715.90) Assessment: Improved doing better now  The following medications were removed from the medication list:    Oxycodone-acetaminophen 5-325 Mg Tabs (Oxycodone-acetaminophen) .Marland Kitchen... 1 every 6 hours as needed His updated medication list for this problem includes:    Aspirin 325 Mg Tbec (Aspirin) .Marland Kitchen... Take one by mouth daily  Complete Medication List: 1)  Neurontin 300 Mg Caps (Gabapentin) .... Take 2 at bedtime 2)  Synthroid 50 Mcg Tabs (Levothyroxine sodium) .... Take one by mouth daily 3)  Prevacid 30 Mg Cpdr (Lansoprazole) .... Take one by mouth daily 4)  Finasteride 5 Mg Tabs (Finasteride) .... Take one by mouth daily 5)  Lantus 100 Unit/ml Soln (Insulin glargine) .... Take 40units subcutaneous every morning. 6)  Furosemide 20 Mg Tabs (Furosemide) .Marland Kitchen.. 1 daily as needed for increased swelling of feet 7)  Novolog 100 Unit/ml Soln (Insulin aspart) .... 40units a.m, 30 units @ lunch, 40units in afternoon and 30units at bedtime 8)  Benazepril Hcl 20 Mg Tabs (Benazepril hcl) .Marland Kitchen.. 1 daily 9)  Amlodipine Besylate 10 Mg Tabs (Amlodipine besylate) .... Take 1 tablet by mouth once a day 10)  Simvastatin 80 Mg Tabs (Simvastatin) .... Take 1 tablet by mouth once a day 11)  Lorazepam 0.5 Mg Tabs (Lorazepam) .Marland Kitchen.. 1 before dialysis, then 1-2 during dialysis as needed. may repeat later in day if needed.  maximum 4 daily 12)  Flomax 0.4 Mg Xr24h-cap (Tamsulosin hcl) .Marland Kitchen.. 1 by mouth daily 13)  Metoprolol Succinate 25 Mg Xr24h-tab (Metoprolol succinate) .Marland Kitchen.. 1 daily 14)  Miralax Powd (Polyethylene glycol 3350) .Marland Kitchen.. 1 capful with water daily to two times a day to prevent constipation 15)  Reglan 5 Mg Tabs (Metoclopramide hcl) .... Take by mouth three times a day and at bedtime 16)  Senna 187 Mg  Tabs (Senna) .... Take 1 by mouth once daily 17)  Aspirin 325 Mg Tbec (Aspirin) .... Take one by mouth daily 18)  Freestyle Lite Strp (Glucose blood) .... Check 4 times daily to facilitate mulitdose insulin adjustments 19)  Bd Insulin Syringe Ultrafine 31g X 5/16" 0.5 Ml Misc (Insulin syringe-needle u-100) .... Use as directed  Patient Instructions: 1)  Please schedule a follow-up appointment in 4 months .  2)  Please set up appt with Dr Patsy Lager to inject trigger finger again  Current Allergies (reviewed today): * FISH

## 2010-09-01 NOTE — Progress Notes (Signed)
Summary: NEURONTI  Phone Note Refill Request Message from:  Patient on Dec 11, 2009 5:46 PM  Refills Requested: Medication #1:  NEURONTIN 300 MG CAPS take 2 at bedtime pt would like refill sent to [facility]   Method Requested: Electronic Initial call taken by: Mervin Hack CMA Duncan Dull),  Dec 11, 2009 5:47 PM  Follow-up for Phone Call        Rx written we can fax if he has number, otherwise leave for pickup Follow-up by: Cindee Salt MD,  Dec 11, 2009 6:00 PM  Additional Follow-up for Phone Call Additional follow up Details #1::        spoke with patient and now he would like it sent to [facility], rx sent to pharmacy. Additional Follow-up by: Mervin Hack CMA Duncan Dull),  Dec 12, 2009 8:34 AM    New/Updated Medications: NEURONTIN 300 MG CAPS (GABAPENTIN) take 2 at bedtime Prescriptions: NEURONTIN 300 MG CAPS (GABAPENTIN) take 2 at bedtime  #180 x 3   Entered by:   Mervin Hack CMA (AAMA)   Authorized by:   Cindee Salt MD   Signed by:   Mervin Hack CMA (AAMA) on 12/12/2009   Method used:   Electronically to        [facility] (retail)       [address]       [place], [place]  [address]       Ph: [phone]       Fax: [phone]   RxID:   [id number] NEURONTIN 300 MG CAPS (GABAPENTIN) take 2 at bedtime  #180 x 3   Entered and Authorized by:   Cindee Salt MD   Signed by:   Cindee Salt MD on 12/11/2009   Method used:   Print then Give to Patient   RxID:   [id number]

## 2010-09-01 NOTE — Letter (Signed)
[facility]  [facility]   Imported By: Lanelle Bal 10/07/2009 11:01:57  _____________________________________________________________________  External Attachment:    Type:   Image     Comment:   External Document

## 2010-09-01 NOTE — Assessment & Plan Note (Signed)
Summary: inject trigger finger/dlo   Vital Signs:  Patient profile:   65 year old male Temp:     98.3 degrees F oral Pulse rate:   84 / minute Pulse rhythm:   regular BP sitting:   130 / 80  (left arm) Cuff size:   large  Vitals Entered By: Benny Lennert CMA Duncan Dull) (January 22, 2010 1:58 PM)  History of Present Illness: Chief complaint injection for trigger finger  Allergies: 1)  * Fish   Impression & Recommendations:  Problem # 1:  TRIGGER FINGER (ICD-727.03)  R 3rd trigger finger has returned -- multiple med problems, DM, ESRD -- ideally could treat without surgery if at all.  Trigger Finger Injection Verbal consent was obtained. Risks, benefits and alternatives were discussed. Prepped with Betadine and Ethyl Chloride used for anesthesia. Under sterile conditions, patient injected at palmar crease aiming distally with 45 degree angle towards nodule; injected directly into tendon sheath. Medication flowed freely without resistance.  Needle size: 22 gauge 1 1/2 inch Injection: 1/2 cc of Marcaine 0.5% and 1/2 cc of Kenalog 40 mg   Orders: Injection, Tendon / Ligament (20550) Kenalog 10mg  (up to 2 units) (J3301)  Complete Medication List: 1)  Neurontin 300 Mg Caps (Gabapentin) .... Take 2 at bedtime 2)  Synthroid 50 Mcg Tabs (Levothyroxine sodium) .... Take one by mouth daily 3)  Prevacid 30 Mg Cpdr (Lansoprazole) .... Take one by mouth daily 4)  Finasteride 5 Mg Tabs (Finasteride) .... Take one by mouth daily 5)  Lantus 100 Unit/ml Soln (Insulin glargine) .... Take 40units subcutaneous every morning. 6)  Furosemide 20 Mg Tabs (Furosemide) .Marland Kitchen.. 1 daily as needed for increased swelling of feet 7)  Novolog 100 Unit/ml Soln (Insulin aspart) .... 40units a.m, 30 units @ lunch, 40units in afternoon and 30units at bedtime 8)  Benazepril Hcl 20 Mg Tabs (Benazepril hcl) .Marland Kitchen.. 1 daily 9)  Amlodipine Besylate 10 Mg Tabs (Amlodipine besylate) .... Take 1 tablet by mouth once a day 10)   Simvastatin 80 Mg Tabs (Simvastatin) .... Take 1 tablet by mouth once a day 11)  Lorazepam 0.5 Mg Tabs (Lorazepam) .Marland Kitchen.. 1 before dialysis, then 1-2 during dialysis as needed. may repeat later in day if needed.  maximum 4 daily 12)  Flomax 0.4 Mg Xr24h-cap (Tamsulosin hcl) .Marland Kitchen.. 1 by mouth daily 13)  Metoprolol Succinate 25 Mg Xr24h-tab (Metoprolol succinate) .Marland Kitchen.. 1 daily 14)  Miralax Powd (Polyethylene glycol 3350) .Marland Kitchen.. 1 capful with water daily to two times a day to prevent constipation 15)  Reglan 5 Mg Tabs (Metoclopramide hcl) .... Take by mouth three times a day and at bedtime 16)  Senna 187 Mg Tabs (Senna) .... Take 1 by mouth once daily 17)  Aspirin 325 Mg Tbec (Aspirin) .... Take one by mouth daily 18)  Freestyle Lite Strp (Glucose blood) .... Check 4 times daily to facilitate mulitdose insulin adjustments 19)  Bd Insulin Syringe Ultrafine 31g X 5/16" 0.5 Ml Misc (Insulin syringe-needle u-100) .... Use as directed  Current Allergies (reviewed today): * FISH

## 2010-09-02 ENCOUNTER — Ambulatory Visit: Payer: Self-pay | Admitting: Ophthalmology

## 2010-09-03 NOTE — Assessment & Plan Note (Signed)
Summary: 4 m f/u dlo   Vital Signs:  Patient profile:   65 year old male Temp:     98.4 degrees F oral Pulse rate:   72 / minute Pulse rhythm:   regular BP sitting:   165 / 75  (left arm) Cuff size:   large  Vitals Entered By: Mervin Hack CMA Duncan Dull) (August 18, 2010 10:44 AM) CC: 4 month follow-up   History of Present Illness: Here with friend Peyton Najjar who transports him he gives some assistance at home also  Having pain in right trapezius muscle uses heat all the time--helps some asks for cortisone shot---explained you can't inject into muscle This is his dialysis arm---discussed positioning better  dialysis has been going okay  Checks sugars about 5 times per day adjusts his regular insulin regularly AM often over 300 Mostly in 200's during the day No symptomatic hypoglycemia Ongoing neuropathy pain in feet  No chest pain No SOB  Ongoing edema Has inflatable pumps he tries to do daily or two times a day Needs help with this Aides in daily -- two times a day  Still sees Dr Dew--had appt last week No ulcers  Hospitalization for C dif in November still gets watery stool about every 3 days  Allergies: 1)  * Fish  Past History:  Past medical, surgical, family and social histories (including risk factors) reviewed for relevance to current acute and chronic problems.  Past Medical History: Reviewed history from 04/01/2009 and no changes required. 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 dif  colitis with SIRS  Family History: Reviewed history from 10/15/2006 and no changes required. Dad died @70 --MI Mom alive at?89 1 sister--breast cancer CAD/HTN in family No other DM (just him)  Social History: Reviewed history from 10/15/2006 and no changes required. 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  NAD Neck:  supple, no masses, and no cervical lymphadenopathy.   Lungs:  normal respiratory effort, no intercostal retractions, no accessory muscle use, and normal breath sounds.   Heart:  normal rate, regular rhythm, and no gallop.   Soft systolic murmur to apex Abdomen:  soft and non-tender.   Extremities:  2+ edema with UNNA boots in place Psych:  normally interactive, good eye contact, not anxious appearing, and not depressed appearing.     Impression & Recommendations:  Problem # 1:  DIABETES MELLITUS, TYPE II, WITH NEUROLOGICAL COMPLICATIONS (ICD-250.60) Assessment Improved  seems to be somewhat better but still labile hope to get A1c closer to 9%  His updated medication list for this problem includes:    Lantus 100 Unit/ml Soln (Insulin glargine) .Marland Kitchen... Take 40units subcutaneous every morning.    Novolog 100 Unit/ml Soln (Insulin aspart) .Marland KitchenMarland KitchenMarland KitchenMarland Kitchen 40units a.m, 30 units @ lunch, 40units in afternoon and 30units at bedtime    Benazepril Hcl 20 Mg Tabs (Benazepril hcl) .Marland Kitchen... 1 daily    Aspirin 325 Mg Tbec (Aspirin) .Marland Kitchen... Take one by mouth daily  Labs Reviewed: Creat: 2.9 (06/05/2008)    Reviewed HgBA1c  results: 10.0 (04/14/2010)  8.3 (11/13/2009)  Orders: TLB-A1C / Hgb A1C (Glycohemoglobin) (83036-A1C) Venipuncture (60454)  Problem # 2:  FAILURE, SYSTOLIC HEART, CHRONIC (ICD-428.22) Assessment: Unchanged compensated No changes needed  His updated medication list for this problem  includes:    Furosemide 20 Mg Tabs (Furosemide) .Marland Kitchen... 1 daily as needed for increased swelling of feet    Benazepril Hcl 20 Mg Tabs (Benazepril hcl) .Marland Kitchen... 1 daily    Metoprolol Succinate 25 Mg Xr24h-tab (Metoprolol succinate) .Marland Kitchen... 1 daily    Aspirin 325 Mg Tbec (Aspirin) .Marland Kitchen... Take one by mouth daily  Problem # 3:  ANXIETY (ICD-300.00) Assessment: Unchanged stable still fires up a lot---this is his personality  His updated medication list for this problem includes:    Lorazepam 0.5 Mg Tabs (Lorazepam) .Marland Kitchen... 1 before dialysis, then 1-2 during dialysis as needed. may repeat later in day if needed.  maximum 4 daily  Problem # 4:  CORONARY ARTERY DISEASE (ICD-414.00) Assessment: Unchanged has been quiet no changes needed  His updated medication list for this problem includes:    Furosemide 20 Mg Tabs (Furosemide) .Marland Kitchen... 1 daily as needed for increased swelling of feet    Benazepril Hcl 20 Mg Tabs (Benazepril hcl) .Marland Kitchen... 1 daily    Amlodipine Besylate 10 Mg Tabs (Amlodipine besylate) .Marland Kitchen... Take 1 tablet by mouth once a day    Metoprolol Succinate 25 Mg Xr24h-tab (Metoprolol succinate) .Marland Kitchen... 1 daily    Aspirin 325 Mg Tbec (Aspirin) .Marland Kitchen... Take one by mouth daily  Complete Medication List: 1)  Neurontin 300 Mg Caps (Gabapentin) .... Take 2 at bedtime 2)  Synthroid 50 Mcg Tabs (Levothyroxine sodium) .... Take one by mouth daily 3)  Prevacid 30 Mg Cpdr (Lansoprazole) .... Take one by mouth daily 4)  Finasteride 5 Mg Tabs (Finasteride) .... Take one by mouth daily 5)  Lantus 100 Unit/ml Soln (Insulin glargine) .... Take 40units subcutaneous every morning. 6)  Furosemide 20 Mg Tabs (Furosemide) .Marland Kitchen.. 1 daily as needed for increased swelling of feet 7)  Novolog 100 Unit/ml Soln (Insulin aspart) .... 40units a.m, 30 units @ lunch, 40units in afternoon and 30units at bedtime 8)  Benazepril Hcl 20 Mg Tabs (Benazepril hcl) .Marland Kitchen.. 1 daily 9)  Amlodipine Besylate 10 Mg Tabs (Amlodipine besylate) ....  Take 1 tablet by mouth once a day 10)  Simvastatin 80 Mg Tabs (Simvastatin) .... Take 1 tablet by mouth once a day 11)  Lorazepam 0.5 Mg Tabs (Lorazepam) .Marland Kitchen.. 1 before dialysis, then 1-2 during dialysis as needed. may repeat later in day if needed.  maximum 4 daily 12)  Flomax 0.4 Mg Xr24h-cap (Tamsulosin hcl) .Marland Kitchen.. 1 by mouth daily 13)  Metoprolol Succinate 25 Mg Xr24h-tab (Metoprolol succinate) .Marland Kitchen.. 1 daily 14)  Miralax Powd (Polyethylene glycol 3350) .Marland Kitchen.. 1 capful with water daily to two times a day to prevent constipation 15)  Reglan 5 Mg Tabs (Metoclopramide hcl) .... Take by mouth three times a day and at bedtime 16)  Senna 187 Mg Tabs (Senna) .... Take 1 by mouth once daily 17)  Aspirin 325 Mg Tbec (Aspirin) .... Take one by mouth daily 18)  Freestyle Lite Strp (Glucose blood) .... Check 4 times daily to facilitate mulitdose insulin adjustments 19)  Bd Insulin Syringe Ultrafine 31g X 5/16" 0.5 Ml Misc (Insulin syringe-needle u-100) .... Use as directed  Patient Instructions: 1)  Please schedule a follow-up appointment in 4 months .  Prescriptions: SYNTHROID 50 MCG  TABS (LEVOTHYROXINE SODIUM) Take one by mouth daily  #  90 x 3   Entered by:   Mervin Hack CMA (AAMA)   Authorized by:   Cindee Salt MD   Signed by:   Mervin Hack CMA (AAMA) on 08/18/2010   Method used:   Electronically to        [facility] (retail)       [address]       [place], [place]  [address]       Ph: [phone]       Fax: [phone]   RxID:   [id number]    Orders Added: 1)  TLB-A1C / Hgb A1C (Glycohemoglobin) [83036-A1C] 2)  Venipuncture [84132] 3)  Est. Patient Level IV [44010]    Current Allergies (reviewed today): * FISH

## 2010-09-08 ENCOUNTER — Encounter: Payer: Self-pay | Admitting: Internal Medicine

## 2010-09-08 ENCOUNTER — Ambulatory Visit (INDEPENDENT_AMBULATORY_CARE_PROVIDER_SITE_OTHER): Payer: Medicare PPO | Admitting: Internal Medicine

## 2010-09-08 DIAGNOSIS — J069 Acute upper respiratory infection, unspecified: Secondary | ICD-10-CM | POA: Insufficient documentation

## 2010-09-12 ENCOUNTER — Inpatient Hospital Stay: Payer: Self-pay | Admitting: Internal Medicine

## 2010-09-13 DIAGNOSIS — R079 Chest pain, unspecified: Secondary | ICD-10-CM

## 2010-09-13 DIAGNOSIS — I214 Non-ST elevation (NSTEMI) myocardial infarction: Secondary | ICD-10-CM

## 2010-09-13 DIAGNOSIS — R7989 Other specified abnormal findings of blood chemistry: Secondary | ICD-10-CM

## 2010-09-14 DIAGNOSIS — R079 Chest pain, unspecified: Secondary | ICD-10-CM

## 2010-09-16 ENCOUNTER — Encounter: Payer: Self-pay | Admitting: Internal Medicine

## 2010-09-17 NOTE — Assessment & Plan Note (Signed)
Summary: ST,RUNNY NOSE/CLE  HUMANA   Vital Signs:  Patient profile:   65 year old male Temp:     98.4 degrees F oral Pulse rate:   74 / minute Pulse rhythm:   regular Resp:     20 per minute BP sitting:   91 / 43  (left arm) Cuff size:   large  Vitals Entered By: Mervin Hack CMA Duncan Dull) (September 08, 2010 11:51 AM) CC: sore throat, congestion   History of Present Illness: Has felt "rough" for the past 4-5 days Started wtih sore throat which is a little better now tried some robitussin as per dialysis folks--some help  Has cough now--seems stuck in throat Cough is dry No SOB  No ear pain SOme nasal congestion---lots of rhinorrhea (clear)  Allergies: 1)  * Fish  Past History:  Past medical, surgical, family and social histories (including risk factors) reviewed for relevance to current acute and chronic problems.  Past Medical History: Reviewed history from 04/01/2009 and no changes required. Congestive heart failure-------EF-30% Coronary artery disease Depression Diabetes mellitus, type II with neuropathy GERD Hypothyroidism--1/07 Benign prostatic hypertrophy Renal insufficiency--creat 2.5-3.4  12/08    ESRD-dialysis started 2/10 Cerebrovascular accident, hx of  2006--R hemiparesis Hypertension Anxiety Osteoarthritis  Past Surgical History: Reviewed history from 08/18/2010 and no changes required. Coronary artery bypass graft--5/06 2006--MRI/MRA/carotids-------basal ganglia infarcts 12/09 DKA--brief hospitalization MI 5/10 by enzymes 11/11 C. dif colitis with SIRS  Family History: Reviewed history from 10/15/2006 and no changes required. Dad died @70 --MI Mom alive at?89 1 sister--breast cancer CAD/HTN in family No other DM (just him)  Social History: Reviewed history from 10/15/2006 and no changes required. Retired/disabled--diesel Curator Former Smoker--quit 12/02 Drug use-no widowed--2 sons  Review of Systems       has had loose stools  off and on for 3 weeks using kaopectate and immodium No vomiting but mild nausea at dialysis yesterday Appetite off after big breakfast  Physical Exam  General:  alert.  NAD Head:  no sinus tenderness Ears:  R ear normal and L ear normal.   Nose:  mild inflammation and swelling Mouth:  no erythema and no exudates.   Neck:  supple, no masses, no thyromegaly, and no cervical lymphadenopathy.   Lungs:  normal respiratory effort, no intercostal retractions, no accessory muscle use, normal breath sounds, no crackles, and no wheezes.     Impression & Recommendations:  Problem # 1:  URI (ICD-465.9) Assessment New clearly seems to be viral infection discussed supportive care  will give Rx for amoxil if seems to get secondary infection  Complete Medication List: 1)  Neurontin 300 Mg Caps (Gabapentin) .... Take 2 at bedtime 2)  Synthroid 50 Mcg Tabs (Levothyroxine sodium) .... Take one by mouth daily 3)  Prevacid 30 Mg Cpdr (Lansoprazole) .... Take one by mouth daily 4)  Finasteride 5 Mg Tabs (Finasteride) .... Take one by mouth daily 5)  Lantus 100 Unit/ml Soln (Insulin glargine) .... Take 40units subcutaneous every morning. 6)  Furosemide 20 Mg Tabs (Furosemide) .Marland Kitchen.. 1 daily as needed for increased swelling of feet 7)  Novolog 100 Unit/ml Soln (Insulin aspart) .... 40units a.m, 30 units @ lunch, 40units in afternoon and 30units at bedtime 8)  Benazepril Hcl 20 Mg Tabs (Benazepril hcl) .Marland Kitchen.. 1 daily 9)  Amlodipine Besylate 10 Mg Tabs (Amlodipine besylate) .... Take 1 tablet by mouth once a day 10)  Simvastatin 80 Mg Tabs (Simvastatin) .... Take 1 tablet by mouth once a day 11)  Lorazepam  0.5 Mg Tabs (Lorazepam) .Marland Kitchen.. 1 before dialysis, then 1-2 during dialysis as needed. may repeat later in day if needed.  maximum 4 daily 12)  Flomax 0.4 Mg Xr24h-cap (Tamsulosin hcl) .Marland Kitchen.. 1 by mouth daily 13)  Metoprolol Succinate 25 Mg Xr24h-tab (Metoprolol succinate) .Marland Kitchen.. 1 daily 14)  Miralax Powd  (Polyethylene glycol 3350) .Marland Kitchen.. 1 capful with water daily to two times a day to prevent constipation 15)  Reglan 5 Mg Tabs (Metoclopramide hcl) .... Take by mouth three times a day and at bedtime 16)  Senna 187 Mg Tabs (Senna) .... Take 1 by mouth once daily 17)  Aspirin 325 Mg Tbec (Aspirin) .... Take one by mouth daily 18)  Freestyle Lite Strp (Glucose blood) .... Check 4 times daily to facilitate mulitdose insulin adjustments 19)  Bd Insulin Syringe Ultrafine 31g X 5/16" 0.5 Ml Misc (Insulin syringe-needle u-100) .... Use as directed 20)  Amoxicillin 500 Mg Tabs (Amoxicillin) .... 2 by mouth two times a day for respiratory infection  Patient Instructions: 1)  Please keep your regular appointment 2)  Please start the antibiotic (amoxicillin) if you get worse--with fever, or productive cough (with yellow green mucus from cough or nose) Prescriptions: AMOXICILLIN 500 MG TABS (AMOXICILLIN) 2 by mouth two times a day for respiratory infection  #40 x 0   Entered and Authorized by:   Cindee Salt MD   Signed by:   Cindee Salt MD on 09/08/2010   Method used:   Print then Give to Patient   RxID:   [id number]    Orders Added: 1)  Est. Patient Level III [29528]    Current Allergies (reviewed today): * FISH  Appended Document: ST,RUNNY NOSE/CLE  HUMANA    Clinical Lists Changes  Observations: Added new observation of PAST SURG HX: Coronary artery bypass graft--5/06 2006--MRI/MRA/carotids-------basal ganglia infarcts 12/09 DKA--brief hospitalization MI 5/10 by enzymes 11/11 C. dif colitis with SIRS 2011 Cataracts OU (09/08/2010 13:30)       Past History:  Past Surgical History: Coronary artery bypass graft--5/06 2006--MRI/MRA/carotids-------basal ganglia infarcts 12/09 DKA--brief hospitalization MI 5/10 by enzymes 11/11 C. dif colitis with SIRS 2011 Cataracts OU

## 2010-09-22 ENCOUNTER — Ambulatory Visit: Payer: Medicare PPO | Admitting: Internal Medicine

## 2010-09-23 ENCOUNTER — Encounter: Payer: Medicare PPO | Admitting: Cardiovascular Disease

## 2010-09-29 NOTE — Letter (Signed)
Summary: [facility]   [facility]   Imported By: Kassie Mends 09/23/2010 10:29:11  _____________________________________________________________________  External Attachment:    Type:   Image     Comment:   External Document  Appended Document: [facility]  hematuria has follow up cystoscopy with Dr Artis Flock

## 2010-10-01 ENCOUNTER — Telehealth: Payer: Self-pay | Admitting: Cardiovascular Disease

## 2010-10-05 IMAGING — CR DG CHEST 1V PORT
1 series · 1 of 1 positions shown · non-contrast
Comparison: none

REASON FOR EXAM: nausea, vomiting, dialysis patient with port
COMMENTS:   LMP: (Male)

PROCEDURE:     DXR - DXR PORTABLE CHEST SINGLE VIEW  - November 27, 2008 [DATE]
RESULT:     Comparison: 10/20/2008

[view not recorded]
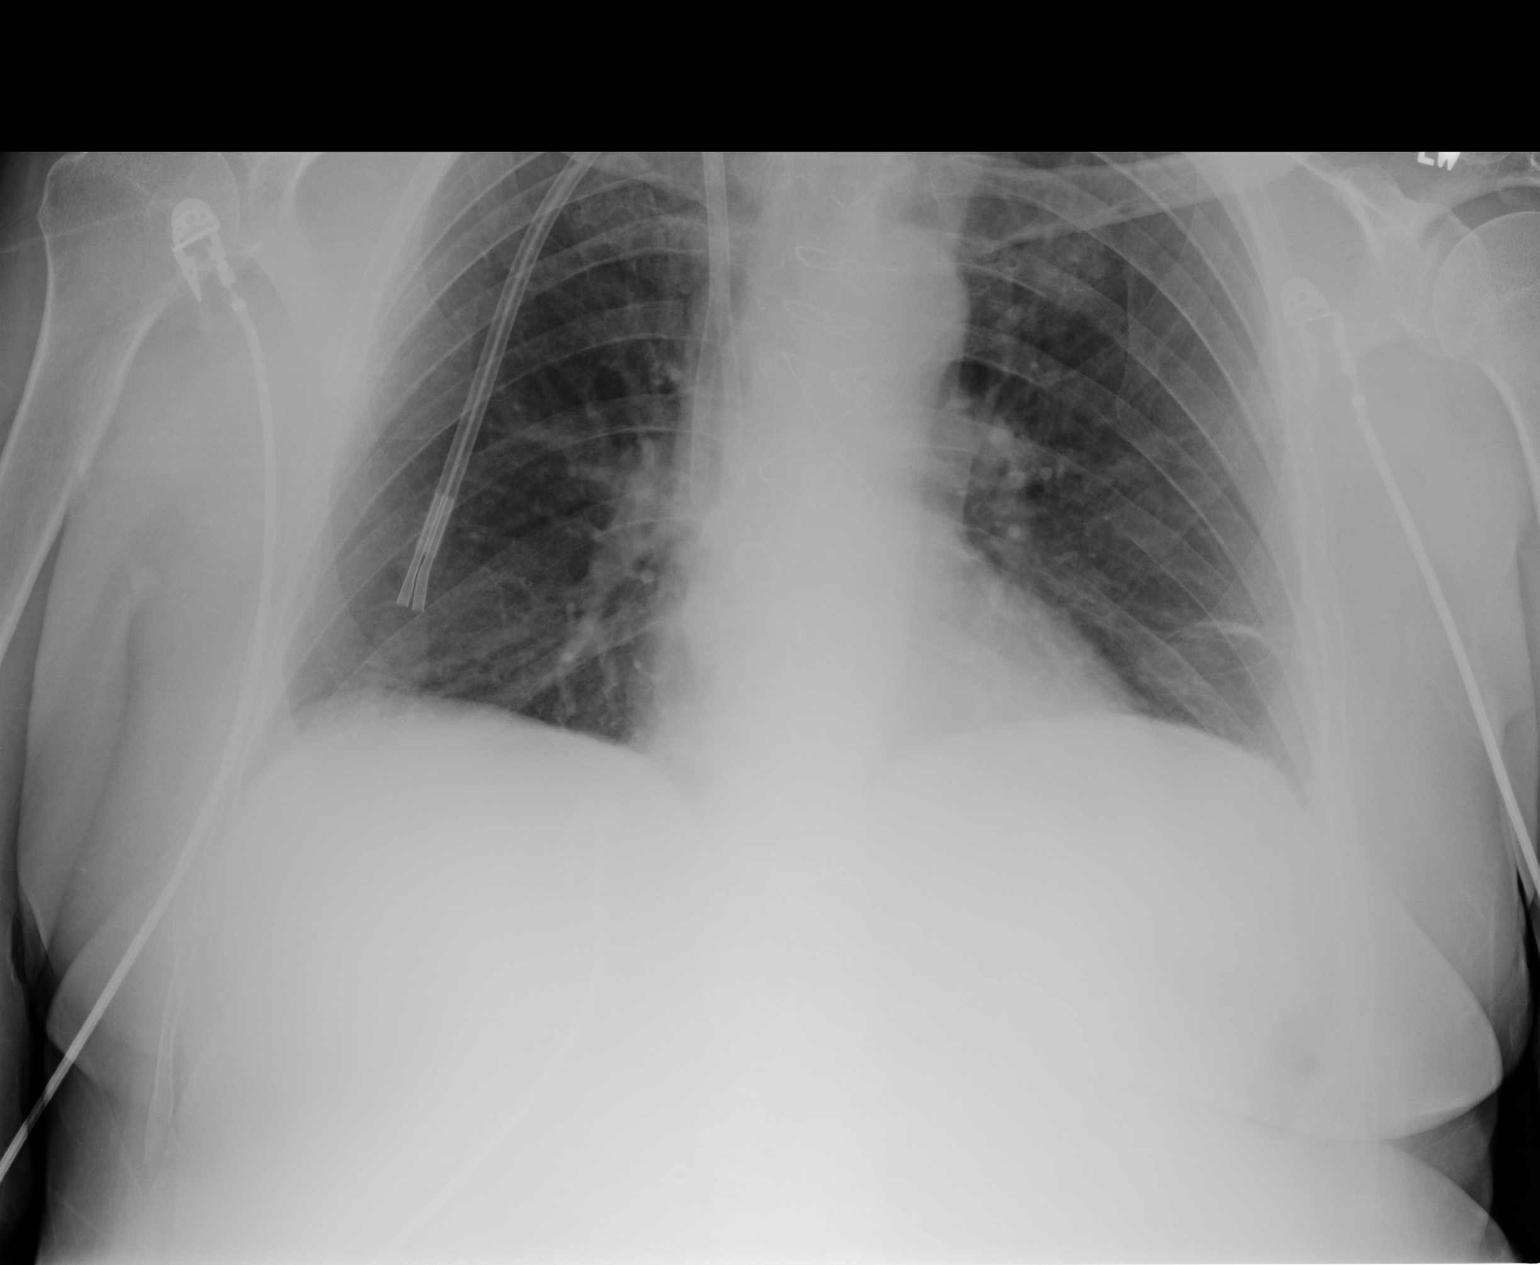

[1 of 1 positions shown; findings below may reference images not displayed]

FINDINGS: Single portable AP chest radiograph is provided. There is a dual lumen
right-sided central venous catheter with in unchanged position. There is an
area of linear airspace disease at the left lung base likely representing
discoid atelectasis versus scarring. There are no areas of focal
consolidation. There is no pleural effusion or pneumothorax. There is
evidence of prior CABG. Normal cardiomediastinal silhouette. The osseous
structures are unremarkable.
IMPRESSION: No acute disease of the chest.

## 2010-10-08 NOTE — Progress Notes (Signed)
Summary: Post hospital followup  Phone Note Outgoing Call   Call placed by: West Carbo,  October 01, 2010 9:51 AM Call placed to: Patient Summary of Call: Called patient to schedule new patient post hospital appointment with Dr. Mariah Milling.  Patient did not want to schedule with our office at this time and said he would follow up with his primary care at [facility] Initial call taken by: West Carbo,  October 01, 2010 9:53 AM

## 2010-10-18 ENCOUNTER — Inpatient Hospital Stay: Payer: Self-pay | Admitting: Internal Medicine

## 2010-10-26 ENCOUNTER — Other Ambulatory Visit: Payer: Self-pay | Admitting: *Deleted

## 2010-10-26 MED ORDER — GLUCOSE BLOOD VI STRP: ORAL_STRIP | Status: DC

## 2010-10-27 ENCOUNTER — Other Ambulatory Visit: Payer: Self-pay | Admitting: *Deleted

## 2010-10-27 MED ORDER — GLUCOSE BLOOD VI STRP: ORAL_STRIP | Status: DC

## 2010-10-27 MED ORDER — "INSULIN SYRINGE-NEEDLE U-100 31G X 5/16"" 0.3 ML MISC": Status: DC

## 2010-10-27 MED ORDER — FREESTYLE LANCETS MISC: Status: AC

## 2010-12-14 ENCOUNTER — Encounter: Payer: Self-pay | Admitting: Internal Medicine

## 2010-12-15 ENCOUNTER — Encounter: Payer: Self-pay | Admitting: Internal Medicine

## 2010-12-15 ENCOUNTER — Ambulatory Visit (INDEPENDENT_AMBULATORY_CARE_PROVIDER_SITE_OTHER): Payer: Medicare PPO | Admitting: Internal Medicine

## 2010-12-15 VITALS — BP 97/42 | HR 65 | Temp 98.6°F

## 2010-12-15 DIAGNOSIS — I10 Essential (primary) hypertension: Secondary | ICD-10-CM

## 2010-12-15 DIAGNOSIS — F329 Major depressive disorder, single episode, unspecified: Secondary | ICD-10-CM

## 2010-12-15 DIAGNOSIS — E785 Hyperlipidemia, unspecified: Secondary | ICD-10-CM

## 2010-12-15 DIAGNOSIS — E039 Hypothyroidism, unspecified: Secondary | ICD-10-CM

## 2010-12-15 DIAGNOSIS — I5022 Chronic systolic (congestive) heart failure: Secondary | ICD-10-CM

## 2010-12-15 DIAGNOSIS — E1149 Type 2 diabetes mellitus with other diabetic neurological complication: Secondary | ICD-10-CM

## 2010-12-15 DIAGNOSIS — N186 End stage renal disease: Secondary | ICD-10-CM

## 2010-12-15 LAB — CBC WITH DIFFERENTIAL/PLATELET
Basophils Relative: 0.8 % (ref 0.0–3.0)
Eosinophils Relative: 4.5 % (ref 0.0–5.0)
HCT: 34.8 % — ABNORMAL LOW (ref 39.0–52.0)
Lymphs Abs: 1.4 10*3/uL (ref 0.7–4.0)
MCV: 101.3 fl — ABNORMAL HIGH (ref 78.0–100.0)
Monocytes Absolute: 0.9 10*3/uL (ref 0.1–1.0)
Monocytes Relative: 12.6 % — ABNORMAL HIGH (ref 3.0–12.0)
RBC: 3.44 Mil/uL — ABNORMAL LOW (ref 4.22–5.81)
WBC: 7.1 10*3/uL (ref 4.5–10.5)

## 2010-12-15 LAB — HEPATIC FUNCTION PANEL
AST: 13 U/L (ref 0–37)
Albumin: 3.2 g/dL — ABNORMAL LOW (ref 3.5–5.2)
Alkaline Phosphatase: 170 U/L — ABNORMAL HIGH (ref 39–117)
Bilirubin, Direct: 0 mg/dL (ref 0.0–0.3)
Total Protein: 6.2 g/dL (ref 6.0–8.3)

## 2010-12-15 LAB — LIPID PANEL
HDL: 52.9 mg/dL (ref >39.00)
Triglycerides: 173 mg/dL — ABNORMAL HIGH (ref 0.0–149.0)

## 2010-12-15 LAB — HEMOGLOBIN A1C: Hgb A1c MFr Bld: 9.9 % — ABNORMAL HIGH (ref 4.6–6.5)

## 2010-12-15 LAB — TSH: TSH: 4.37 u[IU]/mL (ref 0.35–5.50)

## 2010-12-15 NOTE — Patient Instructions (Signed)
Please cut the amlodipine in half and only take 5mg  daily

## 2010-12-15 NOTE — Progress Notes (Signed)
Subjective:    Patient ID: [name], male    DOB: Jul 20, 1946, 65 y.o.   MRN: [id number]  HPI Hospitalized for stomach trouble a couple of months ago Still has intermittent diarrhea Eating okay  Checks sugars 5-6 times per day Running high lately Does adjust insulin based on this Highly labile  Dialysis continues Needed 3 days in a row last week due to fluid accumulation Took 15# off in those 3 days  Chronic leg swelling Keeps them wrapped No ulcers No sig leg pain though  No chest pain No SOB  Mood has been okay Tends to be tired but depression not acting up  Gets tired Thinks his BP will drop  Current outpatient prescriptions:amLODipine (NORVASC) 10 MG tablet, Take 10 mg by mouth daily.  , Disp: , Rfl: ;  aspirin 325 MG tablet, Take 325 mg by mouth daily.  , Disp: , Rfl: ;  benazepril (LOTENSIN) 20 MG tablet, Take 20 mg by mouth daily.  , Disp: , Rfl: ;  finasteride (PROSCAR) 5 MG tablet, Take 5 mg by mouth daily.  , Disp: , Rfl: ;  furosemide (LASIX) 20 MG tablet, Take 20 mg by mouth daily.  , Disp: , Rfl:  gabapentin (NEURONTIN) 300 MG capsule, Take 600 mg by mouth at bedtime.  , Disp: , Rfl: ;  glucose blood (FREESTYLE LITE) test strip, Use to check blood sugar four times daily, Disp: 100 each, Rfl: 12;  insulin aspart (NOVOLOG) 100 UNIT/ML injection, Inject 40 Units into the skin 4 (four) times daily.  , Disp: , Rfl: ;  insulin glargine (LANTUS) 100 UNIT/ML injection, Inject 40 Units into the skin at bedtime.  , Disp: , Rfl:  Insulin Syringe-Needle U-100 31G X 5/16" 0.3 ML MISC, Use to check blood sugar four times daily, Disp: 100 each, Rfl: 12;  Lancets (FREESTYLE) lancets, PRN, Disp: 100 each, Rfl: prn;  lansoprazole (PREVACID) 30 MG capsule, Take 30 mg by mouth daily.  , Disp: , Rfl: ;  levothyroxine (SYNTHROID, LEVOTHROID) 50 MCG tablet, Take 50 mcg by mouth daily.  , Disp: , Rfl:  LORazepam (ATIVAN) 0.5 MG tablet, 1 before dialysis, then 1-2 during dialysis as needed.  May repeat later in day if needed.  Maximum 4 daily , Disp: , Rfl: ;  metoCLOPramide (REGLAN) 5 MG tablet, Take 5 mg by mouth 4 (four) times daily.  , Disp: , Rfl: ;  metoprolol succinate (TOPROL-XL) 25 MG 24 hr tablet, Take 25 mg by mouth daily.  , Disp: , Rfl: ;  polyethylene glycol (MIRALAX / GLYCOLAX) packet, Take 17 g by mouth daily.  , Disp: , Rfl:  sennosides-docusate sodium (SENOKOT-S) 8.6-50 MG tablet, Take 1 tablet by mouth daily.  , Disp: , Rfl: ;  simvastatin (ZOCOR) 80 MG tablet, Take 80 mg by mouth at bedtime.  , Disp: , Rfl: ;  Tamsulosin HCl (FLOMAX) 0.4 MG CAPS, Take 0.4 mg by mouth daily.  , Disp: , Rfl:   Past Medical History  Diagnosis Date  . CAD (coronary artery disease)   . Depression   . Diabetes mellitus   . GERD (gastroesophageal reflux disease)   . Thyroid disease   . BPH (benign prostatic hypertrophy)   . Renal insufficiency     creat 2.5-3.4  12/08    ESRD-dialysis started 2/10  . CVA (cerebral vascular accident)     hx of  2006--R hemiparesis  . Hypertension   . Anxiety   . Arthritis   . Basal ganglia  infarction     2006--MRI/MRA/carotids  . C. difficile colitis     with SIRS  . Cataract     Past Surgical History  Procedure Date  . Coronary artery bypass graft     Family History  Problem Relation Age of Onset  . Cancer Sister     breast    History   Social History  . Marital Status: Single    Spouse Name: N/A    Number of Children: 2  . Years of Education: N/A   Occupational History  . retired-disabled, Games developer    Social History Main Topics  . Smoking status: Former Smoker    Quit date: 07/02/2001  . Smokeless tobacco: Not on file  . Alcohol Use: Not on file  . Drug Use: No  . Sexually Active: Not on file   Other Topics Concern  . Not on file   Social History Narrative  . No narrative on file   Review of Systems Appetite is fine Sleeps is not great----sleeps 1-2 hours at a time Having accupuncture for right neck  pain---is helping some    Objective:   Physical Exam  Constitutional: He appears well-developed. No distress.  Neck: Normal range of motion.  Cardiovascular: Normal rate and regular rhythm.  Exam reveals no gallop.   Murmur heard.      Faint systolic murmur  Pulmonary/Chest: Effort normal and breath sounds normal. No respiratory distress. He has no wheezes. He has no rales.  Abdominal: Soft. There is no tenderness.  Musculoskeletal:       2-3+ tense calf edema  Lymphadenopathy:    He has no cervical adenopathy.  Psychiatric: He has a normal mood and affect. His behavior is normal. Judgment and thought content normal.          Assessment & Plan:

## 2010-12-18 ENCOUNTER — Encounter: Payer: Self-pay | Admitting: *Deleted

## 2010-12-24 ENCOUNTER — Other Ambulatory Visit: Payer: Self-pay | Admitting: *Deleted

## 2010-12-24 MED ORDER — INSULIN ASPART 100 UNIT/ML ~~LOC~~ SOLN: 40.0000 [IU] | Freq: Four times a day (QID) | SUBCUTANEOUS | Status: DC

## 2011-01-07 ENCOUNTER — Telehealth: Payer: Self-pay | Admitting: *Deleted

## 2011-01-07 ENCOUNTER — Ambulatory Visit: Payer: Medicare PPO | Admitting: Internal Medicine

## 2011-01-07 NOTE — Telephone Encounter (Signed)
Caregiver, Dorann Lodge, had called earlier stating pt was complaining of a strong odor to his urine, no other symptoms.  Offered appt at 4:00 today but she said pt wouldn't have transportation.  She said pt would drink plenty of water today and tonight and will call back tomorrow if not better.

## 2011-01-07 NOTE — Telephone Encounter (Signed)
Okay Please check on him in the morning

## 2011-01-20 ENCOUNTER — Other Ambulatory Visit: Payer: Self-pay | Admitting: *Deleted

## 2011-01-20 MED ORDER — BENAZEPRIL HCL 20 MG PO TABS: 20.0000 mg | ORAL_TABLET | Freq: Every day | ORAL | Status: DC

## 2011-02-08 ENCOUNTER — Inpatient Hospital Stay: Payer: Self-pay | Admitting: Internal Medicine

## 2011-02-14 ENCOUNTER — Emergency Department: Payer: Self-pay | Admitting: Emergency Medicine

## 2011-02-17 ENCOUNTER — Emergency Department: Payer: Self-pay | Admitting: Emergency Medicine

## 2011-03-04 ENCOUNTER — Other Ambulatory Visit: Payer: Self-pay | Admitting: *Deleted

## 2011-03-04 MED ORDER — FUROSEMIDE 20 MG PO TABS: 20.0000 mg | ORAL_TABLET | Freq: Every day | ORAL | Status: DC

## 2011-04-20 ENCOUNTER — Ambulatory Visit: Payer: Medicare PPO | Admitting: Internal Medicine

## 2011-05-31 ENCOUNTER — Other Ambulatory Visit: Payer: Self-pay | Admitting: Internal Medicine

## 2011-05-31 NOTE — Telephone Encounter (Signed)
Patient needs prescription for pain meds.  Call back is [phone]

## 2011-06-01 MED ORDER — HYDROCODONE-ACETAMINOPHEN 5-325 MG PO TABS: 1.0000 | ORAL_TABLET | ORAL | Status: DC | PRN

## 2011-06-01 NOTE — Telephone Encounter (Signed)
rx called into pharmacy, Spoke with patient and advised results   

## 2011-06-01 NOTE — Telephone Encounter (Signed)
Okay to send Rx for norco 5/325 1 every 4 hours prn #40 x 0

## 2011-06-01 NOTE — Telephone Encounter (Signed)
Spoke with patient and he's having back pain and would like something for pain, please advise, he has taking Oxycontin 20 mg and oxycodone 5-325 mg in the past, pt doesn't know the name of the medication he was taking.

## 2011-06-01 NOTE — Telephone Encounter (Signed)
Should set up appt soon to review

## 2011-07-06 ENCOUNTER — Other Ambulatory Visit: Payer: Self-pay | Admitting: *Deleted

## 2011-07-06 MED ORDER — "INSULIN SYRINGE-NEEDLE U-100 31G X 5/16"" 0.3 ML MISC": Status: AC

## 2011-07-23 ENCOUNTER — Other Ambulatory Visit: Payer: Self-pay | Admitting: *Deleted

## 2011-07-23 MED ORDER — FUROSEMIDE 20 MG PO TABS: 20.0000 mg | ORAL_TABLET | Freq: Every day | ORAL | Status: AC

## 2011-07-29 ENCOUNTER — Ambulatory Visit: Payer: Self-pay | Admitting: Vascular Surgery

## 2011-09-07 ENCOUNTER — Ambulatory Visit: Payer: Self-pay | Admitting: Vascular Surgery

## 2011-09-07 ENCOUNTER — Other Ambulatory Visit: Payer: Self-pay | Admitting: *Deleted

## 2011-09-07 LAB — CBC
MCH: 33.3 pg (ref 26.0–34.0)
MCHC: 32.2 g/dL (ref 32.0–36.0)
MCV: 103 fL — ABNORMAL HIGH (ref 80–100)
Platelet: 213 10*3/uL (ref 150–440)
RBC: 3.5 10*6/uL — ABNORMAL LOW (ref 4.40–5.90)
RDW: 13.6 % (ref 11.5–14.5)
WBC: 9.2 10*3/uL (ref 3.8–10.6)

## 2011-09-07 LAB — BASIC METABOLIC PANEL
BUN: 40 mg/dL — ABNORMAL HIGH (ref 7–18)
Calcium, Total: 8.7 mg/dL (ref 8.5–10.1)
Co2: 24 mmol/L (ref 21–32)
Creatinine: 3.94 mg/dL — ABNORMAL HIGH (ref 0.60–1.30)
EGFR (African American): 20 — ABNORMAL LOW
EGFR (Non-African Amer.): 16 — ABNORMAL LOW
Potassium: 3.8 mmol/L (ref 3.5–5.1)
Sodium: 142 mmol/L (ref 136–145)

## 2011-09-07 MED ORDER — LEVOTHYROXINE SODIUM 50 MCG PO TABS: 50.0000 ug | ORAL_TABLET | Freq: Every day | ORAL | Status: AC

## 2011-09-09 ENCOUNTER — Ambulatory Visit: Payer: Self-pay | Admitting: Vascular Surgery

## 2011-09-16 ENCOUNTER — Ambulatory Visit: Payer: Medicare PPO | Admitting: Internal Medicine

## 2011-09-28 ENCOUNTER — Ambulatory Visit (INDEPENDENT_AMBULATORY_CARE_PROVIDER_SITE_OTHER): Payer: Medicare PPO | Admitting: Internal Medicine

## 2011-09-28 ENCOUNTER — Encounter: Payer: Self-pay | Admitting: Internal Medicine

## 2011-09-28 VITALS — BP 138/68 | HR 80 | Temp 97.8°F | Ht 71.0 in | Wt 188.0 lb

## 2011-09-28 DIAGNOSIS — E039 Hypothyroidism, unspecified: Secondary | ICD-10-CM

## 2011-09-28 DIAGNOSIS — E1149 Type 2 diabetes mellitus with other diabetic neurological complication: Secondary | ICD-10-CM

## 2011-09-28 DIAGNOSIS — I5022 Chronic systolic (congestive) heart failure: Secondary | ICD-10-CM

## 2011-09-28 DIAGNOSIS — E1140 Type 2 diabetes mellitus with diabetic neuropathy, unspecified: Secondary | ICD-10-CM

## 2011-09-28 DIAGNOSIS — E1142 Type 2 diabetes mellitus with diabetic polyneuropathy: Secondary | ICD-10-CM

## 2011-09-28 DIAGNOSIS — N186 End stage renal disease: Secondary | ICD-10-CM

## 2011-09-28 DIAGNOSIS — F411 Generalized anxiety disorder: Secondary | ICD-10-CM

## 2011-09-28 DIAGNOSIS — E785 Hyperlipidemia, unspecified: Secondary | ICD-10-CM

## 2011-09-28 DIAGNOSIS — N400 Enlarged prostate without lower urinary tract symptoms: Secondary | ICD-10-CM

## 2011-09-28 LAB — LIPID PANEL
Cholesterol: 171 mg/dL (ref 0–200)
HDL: 73.4 mg/dL (ref >39.00)
Triglycerides: 116 mg/dL (ref 0.0–149.0)

## 2011-09-28 LAB — CBC WITH DIFFERENTIAL/PLATELET
Basophils Relative: 0.4 % (ref 0.0–3.0)
Eosinophils Absolute: 0.2 10*3/uL (ref 0.0–0.7)
HCT: 37.9 % — ABNORMAL LOW (ref 39.0–52.0)
Hemoglobin: 12.3 g/dL — ABNORMAL LOW (ref 13.0–17.0)
Lymphocytes Relative: 10.9 % — ABNORMAL LOW (ref 12.0–46.0)
Lymphs Abs: 1.3 10*3/uL (ref 0.7–4.0)
MCHC: 32.4 g/dL (ref 30.0–36.0)
Neutro Abs: 9 10*3/uL — ABNORMAL HIGH (ref 1.4–7.7)
RBC: 3.67 Mil/uL — ABNORMAL LOW (ref 4.22–5.81)

## 2011-09-28 LAB — HEPATIC FUNCTION PANEL
AST: 20 U/L (ref 0–37)
Albumin: 4.2 g/dL (ref 3.5–5.2)
Alkaline Phosphatase: 116 U/L (ref 39–117)
Total Protein: 7.9 g/dL (ref 6.0–8.3)

## 2011-09-28 LAB — BASIC METABOLIC PANEL
Calcium: 9.3 mg/dL (ref 8.4–10.5)
Chloride: 99 mEq/L (ref 96–112)
Creatinine, Ser: 3.7 mg/dL — ABNORMAL HIGH (ref 0.4–1.5)
Sodium: 139 mEq/L (ref 135–145)

## 2011-09-28 LAB — HEMOGLOBIN A1C: Hgb A1c MFr Bld: 9.1 % — ABNORMAL HIGH (ref 4.6–6.5)

## 2011-09-28 LAB — T4, FREE: Free T4: 0.89 ng/dL (ref 0.60–1.60)

## 2011-09-28 NOTE — Assessment & Plan Note (Signed)
On med Not clear it is as useful when he is now on dialysis but will continue

## 2011-09-28 NOTE — Assessment & Plan Note (Signed)
Compensated Edema controlled with UNNA boots No changes

## 2011-09-28 NOTE — Assessment & Plan Note (Signed)
On dialysis

## 2011-09-28 NOTE — Assessment & Plan Note (Signed)
Voids frequently but flow okay on meds No recent UTIs

## 2011-09-28 NOTE — Progress Notes (Signed)
Subjective:    Patient ID: [name], male    DOB: 10/25/1945, 66 y.o.   MRN: [id number]  HPI Tired today Took too much fluid off and he has muscle aches It "wears me out" Finally starts feeling better 2 days after dialysis---so he skips day sometimes  Ongoing diabetic neuropathy Uses gabapentin but still has worsening Uses the hydrocodone 2-3 times per week  Doesn't follow diabetic diet Admits (proudly) to his dietary noncompliance Checks sugars 5-6 times per day and adjusts insulin Only occ hypoglycemic reactions---needed rescue for reaction while at dialysis  No recent hospitalizations other than fistula repair Goes to Dr Wyn Quaker weekly for unna boots No ulcers or infections  Edema controlled with wraps No chest pain No SOB  Voids frequently (esp if sugars up) Still goes often Seems to void okay  Current Outpatient Prescriptions on File Prior to Visit  Medication Sig Dispense Refill  . amLODipine (NORVASC) 10 MG tablet Take 5 mg by mouth daily.       Marland Kitchen aspirin 325 MG tablet Take 325 mg by mouth daily.        . benazepril (LOTENSIN) 20 MG tablet Take 1 tablet (20 mg total) by mouth daily.  30 tablet  5  . finasteride (PROSCAR) 5 MG tablet Take 5 mg by mouth daily.        . furosemide (LASIX) 20 MG tablet Take 1 tablet (20 mg total) by mouth daily.  30 tablet  11  . gabapentin (NEURONTIN) 300 MG capsule Take 600 mg by mouth at bedtime.        Marland Kitchen glucose blood (FREESTYLE LITE) test strip Use to check blood sugar four times daily  100 each  12  . HYDROcodone-acetaminophen (NORCO) 5-325 MG per tablet Take 1 tablet by mouth every 4 (four) hours as needed.  40 tablet  0  . insulin aspart (NOVOLOG) 100 UNIT/ML injection Inject 40 Units into the skin 4 (four) times daily.  10 mL  11  . insulin glargine (LANTUS) 100 UNIT/ML injection Inject 40 Units into the skin at bedtime.        . Insulin Syringe-Needle U-100 31G X 5/16" 0.3 ML MISC Use to check blood sugar four times daily  100  each  12  . Lancets (FREESTYLE) lancets PRN  100 each  prn  . lansoprazole (PREVACID) 30 MG capsule Take 30 mg by mouth daily.        Marland Kitchen levothyroxine (SYNTHROID, LEVOTHROID) 50 MCG tablet Take 1 tablet (50 mcg total) by mouth daily.  90 tablet  3  . LORazepam (ATIVAN) 0.5 MG tablet 1 before dialysis, then 1-2 during dialysis as needed. May repeat later in day if needed.  Maximum 4 daily       . metoCLOPramide (REGLAN) 5 MG tablet Take 5 mg by mouth 4 (four) times daily.        . metoprolol succinate (TOPROL-XL) 25 MG 24 hr tablet Take 25 mg by mouth daily.        . polyethylene glycol (MIRALAX / GLYCOLAX) packet Take 17 g by mouth daily.        . sennosides-docusate sodium (SENOKOT-S) 8.6-50 MG tablet Take 1 tablet by mouth daily.        . simvastatin (ZOCOR) 80 MG tablet Take 80 mg by mouth at bedtime.        . Tamsulosin HCl (FLOMAX) 0.4 MG CAPS Take 0.4 mg by mouth daily.          No Known  Allergies  Past Medical History  Diagnosis Date  . CAD (coronary artery disease)   . Depression   . Diabetes mellitus   . GERD (gastroesophageal reflux disease)   . Thyroid disease   . BPH (benign prostatic hypertrophy)   . Renal insufficiency     creat 2.5-3.4  12/08    ESRD-dialysis started 2/10  . CVA (cerebral vascular accident)     hx of  2006--R hemiparesis  . Hypertension   . Anxiety   . Arthritis   . Basal ganglia infarction     2006--MRI/MRA/carotids  . C. difficile colitis     with SIRS  . Cataract     Past Surgical History  Procedure Date  . Coronary artery bypass graft     Family History  Problem Relation Age of Onset  . Cancer Sister     breast    History   Social History  . Marital Status: Single    Spouse Name: N/A    Number of Children: 2  . Years of Education: N/A   Occupational History  . retired-disabled, Games developer    Social History Main Topics  . Smoking status: Former Smoker    Quit date: 07/02/2001  . Smokeless tobacco: Never Used  .  Alcohol Use: Not on file  . Drug Use: No  . Sexually Active: Not on file   Other Topics Concern  . Not on file   Social History Narrative  . No narrative on file   Review of Systems Toenail ripped off recently---looks okay Catnaps throughout the day Has 3 women for aides---- AM for bath and breakfast, afternoon and then for dinner Friend helps weekends Weight is about the same    Objective:   Physical Exam  Constitutional: He appears well-developed and well-nourished. No distress.  Neck: Normal range of motion.  Cardiovascular: Normal rate, regular rhythm and normal heart sounds.   Pulmonary/Chest: Effort normal and breath sounds normal. No respiratory distress. He has no wheezes. He has no rales.  Musculoskeletal: He exhibits edema.       1-2+ edema with UNNA boots in place  Lymphadenopathy:    He has no cervical adenopathy.  Psychiatric: He has a normal mood and affect. His behavior is normal. Thought content normal.          Assessment & Plan:

## 2011-09-28 NOTE — Assessment & Plan Note (Signed)
Remains ornery  Has the lorazepam for dialysis nerves

## 2011-09-28 NOTE — Assessment & Plan Note (Signed)
Remains totally noncompliant with diet Adjusts insulin Less hypoglycemic reactions of late Will have to accept suboptimal control

## 2011-09-28 NOTE — Assessment & Plan Note (Signed)
Using the gabapentin and then hydrocodone when needed

## 2011-10-07 ENCOUNTER — Encounter: Payer: Self-pay | Admitting: *Deleted

## 2011-10-11 ENCOUNTER — Other Ambulatory Visit: Payer: Self-pay | Admitting: *Deleted

## 2011-10-11 MED ORDER — BENAZEPRIL HCL 20 MG PO TABS: 20.0000 mg | ORAL_TABLET | Freq: Every day | ORAL | Status: AC

## 2011-10-14 ENCOUNTER — Other Ambulatory Visit: Payer: Self-pay | Admitting: *Deleted

## 2011-10-14 MED ORDER — GLUCOSE BLOOD VI STRP: ORAL_STRIP | Status: AC

## 2011-12-07 ENCOUNTER — Ambulatory Visit (INDEPENDENT_AMBULATORY_CARE_PROVIDER_SITE_OTHER)
Admission: RE | Admit: 2011-12-07 | Discharge: 2011-12-07 | Disposition: A | Discharge status: Home / Self Care | Payer: Medicare PPO | Source: Ambulatory Visit | Attending: Internal Medicine | Admitting: Internal Medicine

## 2011-12-07 ENCOUNTER — Ambulatory Visit (INDEPENDENT_AMBULATORY_CARE_PROVIDER_SITE_OTHER): Payer: Medicare PPO | Admitting: Internal Medicine

## 2011-12-07 ENCOUNTER — Encounter: Payer: Self-pay | Admitting: Internal Medicine

## 2011-12-07 VITALS — BP 120/70 | HR 74 | Temp 98.1°F

## 2011-12-07 DIAGNOSIS — M79609 Pain in unspecified limb: Secondary | ICD-10-CM

## 2011-12-07 DIAGNOSIS — M79662 Pain in left lower leg: Secondary | ICD-10-CM

## 2011-12-07 DIAGNOSIS — J019 Acute sinusitis, unspecified: Secondary | ICD-10-CM

## 2011-12-07 DIAGNOSIS — M898X6 Other specified disorders of bone, lower leg: Secondary | ICD-10-CM | POA: Insufficient documentation

## 2011-12-07 MED ORDER — AMOXICILLIN 500 MG PO TABS: 1000.0000 mg | ORAL_TABLET | Freq: Two times a day (BID) | ORAL | Status: AC

## 2011-12-07 NOTE — Assessment & Plan Note (Signed)
Doubt fracture No report yet Will give walking boot if any fracture

## 2011-12-07 NOTE — Progress Notes (Signed)
Subjective:    Patient ID: [name], male    DOB: Jun 19, 1946, 66 y.o.   MRN: [id number]  HPI Larey Seat out of chair 2 weeks ago Checked at wound center---concerned about hairline fracture (no x-ray done) Seemed to improve but then had severe pain again yesterday Doesn't walk but does bear some weight when transferring It hurt when trying to get up  Has had cold for a week Felt so bad he has missed dialysis for a week Low grade fever maybe Lots of cough--non productive No headaches Has sinus pressure and drainage--maxillary No sore throat Hasn't taken meds other than alka seltzer cold and tussin----slight help  Discussed normal thyroid function at last visit  Current Outpatient Prescriptions on File Prior to Visit  Medication Sig Dispense Refill  . amLODipine (NORVASC) 10 MG tablet Take 5 mg by mouth daily.       Marland Kitchen aspirin 325 MG tablet Take 325 mg by mouth daily.        . benazepril (LOTENSIN) 20 MG tablet Take 1 tablet (20 mg total) by mouth daily.  30 tablet  5  . finasteride (PROSCAR) 5 MG tablet Take 5 mg by mouth daily.        . furosemide (LASIX) 20 MG tablet Take 1 tablet (20 mg total) by mouth daily.  30 tablet  11  . gabapentin (NEURONTIN) 300 MG capsule Take 600 mg by mouth at bedtime.        Marland Kitchen glucose blood (FREESTYLE LITE) test strip Use to check blood sugar four times daily  100 each  12  . HYDROcodone-acetaminophen (NORCO) 5-325 MG per tablet Take 1 tablet by mouth every 4 (four) hours as needed.  40 tablet  0  . insulin aspart (NOVOLOG) 100 UNIT/ML injection Inject 40 Units into the skin 4 (four) times daily.  10 mL  11  . insulin glargine (LANTUS) 100 UNIT/ML injection Inject 40 Units into the skin at bedtime.        . Insulin Syringe-Needle U-100 31G X 5/16" 0.3 ML MISC Use to check blood sugar four times daily  100 each  12  . lansoprazole (PREVACID) 30 MG capsule Take 30 mg by mouth daily.        Marland Kitchen levothyroxine (SYNTHROID, LEVOTHROID) 50 MCG tablet Take 1 tablet  (50 mcg total) by mouth daily.  90 tablet  3  . LORazepam (ATIVAN) 0.5 MG tablet 1 before dialysis, then 1-2 during dialysis as needed. May repeat later in day if needed.  Maximum 4 daily       . metoCLOPramide (REGLAN) 5 MG tablet Take 5 mg by mouth 4 (four) times daily.        . metoprolol succinate (TOPROL-XL) 25 MG 24 hr tablet Take 25 mg by mouth daily.        . polyethylene glycol (MIRALAX / GLYCOLAX) packet Take 17 g by mouth daily.        . sennosides-docusate sodium (SENOKOT-S) 8.6-50 MG tablet Take 1 tablet by mouth daily.        . simvastatin (ZOCOR) 80 MG tablet Take 80 mg by mouth at bedtime.        . Tamsulosin HCl (FLOMAX) 0.4 MG CAPS Take 0.4 mg by mouth daily.          No Known Allergies  Past Medical History  Diagnosis Date  . CAD (coronary artery disease)   . Depression   . Diabetes mellitus   . GERD (gastroesophageal reflux disease)   .  Thyroid disease   . BPH (benign prostatic hypertrophy)   . Renal insufficiency     creat 2.5-3.4  12/08    ESRD-dialysis started 2/10  . CVA (cerebral vascular accident)     hx of  2006--R hemiparesis  . Hypertension   . Anxiety   . Arthritis   . Basal ganglia infarction     2006--MRI/MRA/carotids  . C. difficile colitis     with SIRS  . Cataract     Past Surgical History  Procedure Date  . Coronary artery bypass graft     Family History  Problem Relation Age of Onset  . Cancer Sister     breast    History   Social History  . Marital Status: Single    Spouse Name: N/A    Number of Children: 2  . Years of Education: N/A   Occupational History  . retired-disabled, Games developer    Social History Main Topics  . Smoking status: Former Smoker    Quit date: 07/02/2001  . Smokeless tobacco: Never Used  . Alcohol Use: Not on file  . Drug Use: No  . Sexually Active: Not on file   Other Topics Concern  . Not on file   Social History Narrative  . No narrative on file   Review of Systems No rash No  vomiting or diarrhea Still eating okay     Objective:   Physical Exam  Constitutional: He appears well-developed and well-nourished. No distress.  HENT:  Mouth/Throat: Oropharynx is clear and moist. No oropharyngeal exudate.       No sinus tenderness Moderate nasal inflammation TMs normal  Neck: Normal range of motion. Neck supple.  Pulmonary/Chest: Effort normal and breath sounds normal. No respiratory distress. He has no wheezes. He has no rales.  Musculoskeletal:       Mild localized bony tenderness along left tibia---middle and below  Lymphadenopathy:    He has no cervical adenopathy.          Assessment & Plan:

## 2011-12-07 NOTE — Assessment & Plan Note (Signed)
Seems likely to be viral but sick enough to miss dialysis Will Rx with amoxil

## 2011-12-09 ENCOUNTER — Emergency Department: Payer: Self-pay | Admitting: Emergency Medicine

## 2011-12-09 LAB — COMPREHENSIVE METABOLIC PANEL
Alkaline Phosphatase: 120 U/L (ref 50–136)
Anion Gap: 11 (ref 7–16)
Bilirubin,Total: 0.4 mg/dL (ref 0.2–1.0)
Calcium, Total: 8.7 mg/dL (ref 8.5–10.1)
Chloride: 105 mmol/L (ref 98–107)
EGFR (African American): 22 — ABNORMAL LOW
EGFR (Non-African Amer.): 19 — ABNORMAL LOW
Glucose: 142 mg/dL — ABNORMAL HIGH (ref 65–99)
Osmolality: 292 (ref 275–301)
Potassium: 4.1 mmol/L (ref 3.5–5.1)
Sodium: 142 mmol/L (ref 136–145)

## 2011-12-09 LAB — PROTIME-INR
INR: 0.9
Prothrombin Time: 12.6 secs (ref 11.5–14.7)

## 2011-12-09 LAB — CBC WITH DIFFERENTIAL/PLATELET
Basophil #: 0.2 10*3/uL — ABNORMAL HIGH (ref 0.0–0.1)
Basophil %: 2.5 %
Eosinophil #: 0.1 10*3/uL (ref 0.0–0.7)
HCT: 37 % — ABNORMAL LOW (ref 40.0–52.0)
HGB: 12.2 g/dL — ABNORMAL LOW (ref 13.0–18.0)
Lymphocyte %: 14.1 %
MCHC: 33 g/dL (ref 32.0–36.0)
MCV: 101 fL — ABNORMAL HIGH (ref 80–100)
Neutrophil %: 72.8 %
RBC: 3.66 10*6/uL — ABNORMAL LOW (ref 4.40–5.90)
RDW: 12.6 % (ref 11.5–14.5)
WBC: 8.1 10*3/uL (ref 3.8–10.6)

## 2011-12-09 LAB — LIPASE, BLOOD: Lipase: 68 U/L — ABNORMAL LOW (ref 73–393)

## 2011-12-09 LAB — CK TOTAL AND CKMB (NOT AT ARMC): CK, Total: 87 U/L (ref 35–232)

## 2011-12-28 ENCOUNTER — Other Ambulatory Visit: Payer: Self-pay | Admitting: *Deleted

## 2011-12-28 MED ORDER — LANSOPRAZOLE 30 MG PO CPDR: 30.0000 mg | DELAYED_RELEASE_CAPSULE | Freq: Every day | ORAL | Status: AC

## 2011-12-28 NOTE — Telephone Encounter (Signed)
Fax request asking for refill of protonix rx'd 02/17/11 by Dr. Bayard Males, this medication is not on active nor non-active med list, pt on prevacid. Please advise

## 2011-12-28 NOTE — Telephone Encounter (Signed)
Find out which one he is taking (they are about the same) and okay to fill for a year Update med list if he is on the protonix instead of the prevacid

## 2011-12-28 NOTE — Telephone Encounter (Signed)
[facility] of [facility]. Explained pt said taking prevacid. [facility] said [place].

## 2011-12-28 NOTE — Telephone Encounter (Signed)
Spoke with patient and he takes prevacid, rx sent to pharmacy by e-script

## 2012-01-07 ENCOUNTER — Other Ambulatory Visit: Payer: Self-pay | Admitting: Internal Medicine

## 2012-02-23 ENCOUNTER — Inpatient Hospital Stay: Payer: Self-pay | Admitting: Specialist

## 2012-02-23 LAB — CBC
HCT: 36.6 % — ABNORMAL LOW (ref 40.0–52.0)
HGB: 11.1 g/dL — ABNORMAL LOW (ref 13.0–18.0)
MCV: 106 fL — ABNORMAL HIGH (ref 80–100)
RBC: 3.45 10*6/uL — ABNORMAL LOW (ref 4.40–5.90)
WBC: 9 10*3/uL (ref 3.8–10.6)

## 2012-02-23 LAB — URINALYSIS, COMPLETE
Bilirubin,UR: NEGATIVE
Ph: 6 (ref 4.5–8.0)
Protein: 100
RBC,UR: 5 /HPF (ref 0–5)
Squamous Epithelial: 1
WBC UR: 743 /HPF (ref 0–5)

## 2012-02-23 LAB — COMPREHENSIVE METABOLIC PANEL
Albumin: 3.6 g/dL (ref 3.4–5.0)
Alkaline Phosphatase: 159 U/L — ABNORMAL HIGH (ref 50–136)
Anion Gap: 10 (ref 7–16)
BUN: 42 mg/dL — ABNORMAL HIGH (ref 7–18)
Calcium, Total: 9.4 mg/dL (ref 8.5–10.1)
Glucose: 442 mg/dL — ABNORMAL HIGH (ref 65–99)
Potassium: 5.9 mmol/L — ABNORMAL HIGH (ref 3.5–5.1)
SGOT(AST): 18 U/L (ref 15–37)
SGPT (ALT): 16 U/L
Total Protein: 7.5 g/dL (ref 6.4–8.2)

## 2012-02-23 LAB — LIPASE, BLOOD: Lipase: 37 U/L — ABNORMAL LOW (ref 73–393)

## 2012-02-24 LAB — BASIC METABOLIC PANEL
BUN: 69 mg/dL — ABNORMAL HIGH (ref 7–18)
Calcium, Total: 8.4 mg/dL — ABNORMAL LOW (ref 8.5–10.1)
Chloride: 115 mmol/L — ABNORMAL HIGH (ref 98–107)
Creatinine: 6.37 mg/dL — ABNORMAL HIGH (ref 0.60–1.30)
EGFR (Non-African Amer.): 8 — ABNORMAL LOW
Glucose: 134 mg/dL — ABNORMAL HIGH (ref 65–99)
Osmolality: 315 (ref 275–301)
Potassium: 3.2 mmol/L — ABNORMAL LOW (ref 3.5–5.1)
Sodium: 147 mmol/L — ABNORMAL HIGH (ref 136–145)

## 2012-02-25 LAB — CBC WITH DIFFERENTIAL/PLATELET
Basophil #: 0.1 10*3/uL (ref 0.0–0.1)
Eosinophil #: 0.3 10*3/uL (ref 0.0–0.7)
HCT: 32.2 % — ABNORMAL LOW (ref 40.0–52.0)
Lymphocyte %: 17.5 %
MCHC: 33.4 g/dL (ref 32.0–36.0)
MCV: 102 fL — ABNORMAL HIGH (ref 80–100)
Monocyte %: 13.7 %
Neutrophil %: 63.1 %
RBC: 3.18 10*6/uL — ABNORMAL LOW (ref 4.40–5.90)
RDW: 13.5 % (ref 11.5–14.5)
WBC: 6.2 10*3/uL (ref 3.8–10.6)

## 2012-02-25 LAB — PHOSPHORUS: Phosphorus: 4.4 mg/dL (ref 2.5–4.9)

## 2012-03-02 DIAGNOSIS — I82629 Acute embolism and thrombosis of deep veins of unspecified upper extremity: Secondary | ICD-10-CM

## 2012-03-02 HISTORY — DX: Acute embolism and thrombosis of deep veins of unspecified upper extremity: I82.629

## 2012-03-03 ENCOUNTER — Ambulatory Visit: Payer: Medicare PPO | Admitting: Internal Medicine

## 2012-03-03 DIAGNOSIS — Z0289 Encounter for other administrative examinations: Secondary | ICD-10-CM

## 2012-03-15 ENCOUNTER — Inpatient Hospital Stay: Payer: Self-pay | Admitting: Internal Medicine

## 2012-03-15 LAB — CK TOTAL AND CKMB (NOT AT ARMC)
CK, Total: 62 U/L (ref 35–232)
CK-MB: 4 ng/mL — ABNORMAL HIGH (ref 0.5–3.6)

## 2012-03-15 LAB — CBC
HGB: 10.6 g/dL — ABNORMAL LOW (ref 13.0–18.0)
MCH: 33.2 pg (ref 26.0–34.0)
MCV: 104 fL — ABNORMAL HIGH (ref 80–100)
RBC: 3.19 10*6/uL — ABNORMAL LOW (ref 4.40–5.90)
RDW: 14.5 % (ref 11.5–14.5)
WBC: 6 10*3/uL (ref 3.8–10.6)

## 2012-03-15 LAB — COMPREHENSIVE METABOLIC PANEL
Alkaline Phosphatase: 165 U/L — ABNORMAL HIGH (ref 50–136)
Anion Gap: 16 (ref 7–16)
BUN: 45 mg/dL — ABNORMAL HIGH (ref 7–18)
Bilirubin,Total: 0.3 mg/dL (ref 0.2–1.0)
Calcium, Total: 8.7 mg/dL (ref 8.5–10.1)
Chloride: 105 mmol/L (ref 98–107)
Creatinine: 4.37 mg/dL — ABNORMAL HIGH (ref 0.60–1.30)
EGFR (African American): 15 — ABNORMAL LOW
Glucose: 166 mg/dL — ABNORMAL HIGH (ref 65–99)
Osmolality: 295 (ref 275–301)
Potassium: 4.9 mmol/L (ref 3.5–5.1)
SGOT(AST): 35 U/L (ref 15–37)
Sodium: 140 mmol/L (ref 136–145)
Total Protein: 6.8 g/dL (ref 6.4–8.2)

## 2012-03-15 LAB — PHOSPHORUS: Phosphorus: 4.8 mg/dL (ref 2.5–4.9)

## 2012-03-16 LAB — CBC WITH DIFFERENTIAL/PLATELET
Basophil %: 1.1 %
Eosinophil #: 0.6 10*3/uL (ref 0.0–0.7)
Lymphocyte #: 1.2 10*3/uL (ref 1.0–3.6)
MCH: 34.1 pg — ABNORMAL HIGH (ref 26.0–34.0)
MCHC: 33 g/dL (ref 32.0–36.0)
MCV: 103 fL — ABNORMAL HIGH (ref 80–100)
Monocyte #: 0.7 x10 3/mm (ref 0.2–1.0)
Neutrophil #: 2.9 10*3/uL (ref 1.4–6.5)
Neutrophil %: 54.1 %
Platelet: 141 10*3/uL — ABNORMAL LOW (ref 150–440)
RBC: 2.86 10*6/uL — ABNORMAL LOW (ref 4.40–5.90)
RDW: 14.3 % (ref 11.5–14.5)
WBC: 5.5 10*3/uL (ref 3.8–10.6)

## 2012-03-16 LAB — BASIC METABOLIC PANEL
Anion Gap: 8 (ref 7–16)
BUN: 33 mg/dL — ABNORMAL HIGH (ref 7–18)
Calcium, Total: 8.4 mg/dL — ABNORMAL LOW (ref 8.5–10.1)
Co2: 28 mmol/L (ref 21–32)
Creatinine: 3.59 mg/dL — ABNORMAL HIGH (ref 0.60–1.30)
EGFR (African American): 19 — ABNORMAL LOW
EGFR (Non-African Amer.): 16 — ABNORMAL LOW
Glucose: 213 mg/dL — ABNORMAL HIGH (ref 65–99)
Osmolality: 289 (ref 275–301)
Sodium: 138 mmol/L (ref 136–145)

## 2012-03-16 LAB — HEMOGLOBIN A1C: Hemoglobin A1C: 8.6 % — ABNORMAL HIGH (ref 4.2–6.3)

## 2012-03-16 LAB — MAGNESIUM: Magnesium: 2.1 mg/dL

## 2012-03-19 LAB — STOOL CULTURE

## 2012-03-20 ENCOUNTER — Telehealth: Payer: Self-pay

## 2012-03-20 LAB — CULTURE, BLOOD (SINGLE)

## 2012-03-20 NOTE — Telephone Encounter (Signed)
Spoke with caregiver and advised results, she will call the [facility] Niece back to take pt to dialysis.

## 2012-03-20 NOTE — Telephone Encounter (Signed)
Junious Dresser, care giver called; pt discharged [facility] on 03/17/12 for CHF. Was told pt has left bundle branch block, new diagnosis Junious Dresser wants to know if pt should go to kidney dialysis today. Pt has appt scheduled with Dr Alphonsus Sias 03/28/12.Please advise.

## 2012-03-20 NOTE — Telephone Encounter (Signed)
Yes Should go to dialysis if this is his scheduled day!!

## 2012-03-21 LAB — CULTURE, BLOOD (SINGLE)

## 2012-03-22 LAB — CULTURE, BLOOD (SINGLE)

## 2012-03-28 ENCOUNTER — Ambulatory Visit: Payer: Medicare PPO | Admitting: Internal Medicine

## 2012-03-29 ENCOUNTER — Ambulatory Visit: Payer: Medicare PPO | Admitting: Internal Medicine

## 2012-03-29 DIAGNOSIS — Z0289 Encounter for other administrative examinations: Secondary | ICD-10-CM

## 2012-04-11 ENCOUNTER — Ambulatory Visit: Payer: Medicare PPO | Admitting: Internal Medicine

## 2012-04-11 DIAGNOSIS — Z0289 Encounter for other administrative examinations: Secondary | ICD-10-CM

## 2012-04-12 ENCOUNTER — Emergency Department: Payer: Self-pay | Admitting: Emergency Medicine

## 2012-04-12 ENCOUNTER — Telehealth: Payer: Self-pay

## 2012-04-12 ENCOUNTER — Emergency Department: Payer: Self-pay | Admitting: Unknown Physician Specialty

## 2012-04-12 ENCOUNTER — Telehealth: Payer: Self-pay | Admitting: *Deleted

## 2012-04-12 NOTE — Telephone Encounter (Signed)
Check on his status tomorrow to see if he will still be coming for his visit here in the afternoon

## 2012-04-12 NOTE — Telephone Encounter (Signed)
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

## 2012-04-12 NOTE — Telephone Encounter (Signed)
Okay, I hope so  He needs to call if he is not coming in for appt, not just not show up Will discuss with him tomorrow

## 2012-04-12 NOTE — Telephone Encounter (Signed)
[name] was told by person who gave pt a bath this morning that pt cut his leg on something; leg was wrapped around 9 am this morning. [name] said pt went to dialysis this AM; [name] found 4-5 places of blood (size of a saucer on the floor) where pts leg had bled. I called diaysis [phone] spoke with [facility]; pt's wrap, pant leg, sock is wet with blood and pt is actively dripping blood on floor at dialysis now.  Pt refuses to go to ER. Dr Alphonsus Sias will see in office; pt has just started dialysis treatment; [name] spoke with pt again; pt agrees to go to ER and [name] will speak with nephrologist whether to stop dialysis and send pt to ER now.(Heparin was not used with dialysis this AM). [name] notified while still on phone; she will contact pts son.Spoke with [facility] nephrologist is having dialysis stopped; pt going to [facility] ER and will have dialylsis done while at hospital.

## 2012-04-13 ENCOUNTER — Ambulatory Visit (INDEPENDENT_AMBULATORY_CARE_PROVIDER_SITE_OTHER): Payer: Medicare PPO | Admitting: Internal Medicine

## 2012-04-13 ENCOUNTER — Encounter: Payer: Self-pay | Admitting: Internal Medicine

## 2012-04-13 VITALS — BP 138/70 | HR 72 | Temp 98.4°F

## 2012-04-13 DIAGNOSIS — I82629 Acute embolism and thrombosis of deep veins of unspecified upper extremity: Secondary | ICD-10-CM | POA: Insufficient documentation

## 2012-04-13 DIAGNOSIS — I5022 Chronic systolic (congestive) heart failure: Secondary | ICD-10-CM

## 2012-04-13 DIAGNOSIS — S81819A Laceration without foreign body, unspecified lower leg, initial encounter: Secondary | ICD-10-CM | POA: Insufficient documentation

## 2012-04-13 DIAGNOSIS — S81009A Unspecified open wound, unspecified knee, initial encounter: Secondary | ICD-10-CM

## 2012-04-13 LAB — POCT INR: INR: 2.1

## 2012-04-13 NOTE — Progress Notes (Signed)
Subjective:    Patient ID: [name], male    DOB: [birth year], 66 y.o.   MRN: [id number]  HPI Here with aide Complex medicine reconciliation ---med changes at [facility] due to breathing problems Echo showed LV dysfunction and diagnosed with CHF Diagnosed with blood clots around past shunt in right shoulder area Also had atrial fibrillation on his records Started on warfarin there---doesn't know if he has had any protime checks since starting  Cut his leg yesterday morning (may just be a skin tear) Dialysis had called here yesterday saying he was leaking blood Eventually sent to ER for the bleeding Staples and stitches placed at [facility] last night Told to hold coumadin for 1 day and then keep appt here  No SOB No chest pain BP will drop at times---running on low side  Sugar has dropped occ--as often as 3-4 times per week High other times Usual labile values Checks 5-6 times per day---uses sliding scale for this. Hypoglycemia doesn't seem to be related to when he uses the regular insulin  Current Outpatient Prescriptions on File Prior to Visit  Medication Sig Dispense Refill  . amLODipine (NORVASC) 10 MG tablet Take 10 mg by mouth daily.       Marland Kitchen aspirin 325 MG tablet Take 325 mg by mouth daily.        Marland Kitchen atorvastatin (LIPITOR) 80 MG tablet Take 80 mg by mouth daily.      . benazepril (LOTENSIN) 20 MG tablet Take 1 tablet (20 mg total) by mouth daily.  30 tablet  5  . finasteride (PROSCAR) 5 MG tablet Take 5 mg by mouth daily.        . furosemide (LASIX) 20 MG tablet Take 1 tablet (20 mg total) by mouth daily.  30 tablet  11  . gabapentin (NEURONTIN) 300 MG capsule Take 600 mg by mouth at bedtime.        Marland Kitchen glucose blood (FREESTYLE LITE) test strip Use to check blood sugar four times daily  100 each  12  . HYDROcodone-acetaminophen (NORCO) 5-325 MG per tablet Take 1 tablet by mouth every 4 (four) hours as needed.  40 tablet  0  . insulin glargine (LANTUS) 100 UNIT/ML injection  Inject 40 Units into the skin at bedtime.        . Insulin Syringe-Needle U-100 31G X 5/16" 0.3 ML MISC Use to check blood sugar four times daily  100 each  12  . lansoprazole (PREVACID) 30 MG capsule Take 1 capsule (30 mg total) by mouth daily.  90 capsule  3  . levothyroxine (SYNTHROID, LEVOTHROID) 50 MCG tablet Take 1 tablet (50 mcg total) by mouth daily.  90 tablet  3  . metoprolol succinate (TOPROL-XL) 25 MG 24 hr tablet Take 25 mg by mouth daily.        Marland Kitchen NOVOLOG 100 UNIT/ML injection INJECT 40 UNITS INTO SKIN FOUR TIMES    A DAY  100 mL  11  . omeprazole (PRILOSEC) 20 MG capsule Take 20 mg by mouth daily.      . sennosides-docusate sodium (SENOKOT-S) 8.6-50 MG tablet Take 1 tablet by mouth daily.        Marland Kitchen warfarin (COUMADIN) 4 MG tablet Take 4 mg by mouth daily.        No Known Allergies  Past Medical History  Diagnosis Date  . CAD (coronary artery disease)   . Depression   . Diabetes mellitus   . GERD (gastroesophageal reflux disease)   . Thyroid  disease   . BPH (benign prostatic hypertrophy)   . Renal insufficiency     creat 2.5-3.4  12/08    ESRD-dialysis started 2/10  . CVA (cerebral vascular accident)     hx of  2006--R hemiparesis  . Hypertension   . Anxiety   . Arthritis   . Basal ganglia infarction     2006--MRI/MRA/carotids  . C. difficile colitis     with SIRS  . Cataract     Past Surgical History  Procedure Date  . Coronary artery bypass graft     Family History  Problem Relation Age of Onset  . Cancer Sister     breast    History   Social History  . Marital Status: Single    Spouse Name: N/A    Number of Children: 2  . Years of Education: N/A   Occupational History  . retired-disabled, Games developer    Social History Main Topics  . Smoking status: Former Smoker    Quit date: 07/02/2001  . Smokeless tobacco: Never Used  . Alcohol Use: Not on file  . Drug Use: No  . Sexually Active: Not on file   Other Topics Concern  . Not on file     Social History Narrative  . No narrative on file   Review of Systems Appetite is okay Leg swelling is stable---had been on the wraps till yesterday     Objective:   Physical Exam  Constitutional: He appears well-developed. No distress.  Neck: Normal range of motion. No thyromegaly present.  Cardiovascular: Normal rate, regular rhythm and normal heart sounds.  Exam reveals no gallop.   No murmur heard. Pulmonary/Chest: Effort normal and breath sounds normal. No respiratory distress. He has no wheezes. He has no rales.  Abdominal: Soft. There is no tenderness.  Musculoskeletal: He exhibits edema.  Lymphadenopathy:    He has no cervical adenopathy.  Skin:       ~8cm curved laceration on lower right calf Closed with staples and sutures with good hemostasis          Assessment & Plan:

## 2012-04-13 NOTE — Telephone Encounter (Signed)
Left message with caregiver and son to return my call, tried calling pt's home number and line was busy.

## 2012-04-13 NOTE — Assessment & Plan Note (Signed)
Not sure how it happened Will reevaluate in 1 week and remove staples and stitches

## 2012-04-13 NOTE — Telephone Encounter (Signed)
Patient came in for OV 

## 2012-04-13 NOTE — Patient Instructions (Signed)
Continue 4 mg daily, recheck 1 week at return ov

## 2012-04-13 NOTE — Assessment & Plan Note (Signed)
New diagnosis On coumadin now--?6 month duration Need to review discharge summary  Stop ASA for now INR 2.1 today Will continue current coumadin and recheck in 1 month

## 2012-04-13 NOTE — Assessment & Plan Note (Signed)
??  exacerbation at [facility] Not sure why they would stop his furosemide though New echo reviewed  Not symptomatic now Will get discharge summary to review before follow up

## 2012-04-14 ENCOUNTER — Observation Stay: Payer: Self-pay | Admitting: Family Medicine

## 2012-04-14 LAB — CBC WITH DIFFERENTIAL/PLATELET
Eosinophil %: 5.6 %
HGB: 9.6 g/dL — ABNORMAL LOW (ref 13.0–18.0)
Lymphocyte #: 1 10*3/uL (ref 1.0–3.6)
Lymphocyte %: 13.2 %
MCH: 33.2 pg (ref 26.0–34.0)
MCHC: 32.3 g/dL (ref 32.0–36.0)
Monocyte #: 1.1 x10 3/mm — ABNORMAL HIGH (ref 0.2–1.0)
Monocyte %: 15.3 %
Neutrophil %: 64.7 %
Platelet: 196 10*3/uL (ref 150–440)

## 2012-04-14 LAB — COMPREHENSIVE METABOLIC PANEL
Albumin: 3.2 g/dL — ABNORMAL LOW (ref 3.4–5.0)
Alkaline Phosphatase: 177 U/L — ABNORMAL HIGH (ref 50–136)
BUN: 35 mg/dL — ABNORMAL HIGH (ref 7–18)
Co2: 26 mmol/L (ref 21–32)
Creatinine: 3.94 mg/dL — ABNORMAL HIGH (ref 0.60–1.30)
EGFR (African American): 17 — ABNORMAL LOW
EGFR (Non-African Amer.): 15 — ABNORMAL LOW
Glucose: 103 mg/dL — ABNORMAL HIGH (ref 65–99)
SGPT (ALT): 19 U/L (ref 12–78)
Sodium: 138 mmol/L (ref 136–145)
Total Protein: 7 g/dL (ref 6.4–8.2)

## 2012-04-14 LAB — PRO B NATRIURETIC PEPTIDE: B-Type Natriuretic Peptide: 26513 pg/mL — ABNORMAL HIGH (ref 0–125)

## 2012-04-14 LAB — TROPONIN I
Troponin-I: 0.04 ng/mL
Troponin-I: 0.03 ng/mL

## 2012-04-15 LAB — COMPREHENSIVE METABOLIC PANEL
BUN: 37 mg/dL — ABNORMAL HIGH (ref 7–18)
Bilirubin,Total: 0.4 mg/dL (ref 0.2–1.0)
Chloride: 104 mmol/L (ref 98–107)
Co2: 26 mmol/L (ref 21–32)
Creatinine: 4.13 mg/dL — ABNORMAL HIGH (ref 0.60–1.30)
EGFR (African American): 16 — ABNORMAL LOW
Osmolality: 288 (ref 275–301)
Potassium: 4.6 mmol/L (ref 3.5–5.1)
SGOT(AST): 15 U/L (ref 15–37)
SGPT (ALT): 17 U/L (ref 12–78)
Sodium: 141 mmol/L (ref 136–145)
Total Protein: 6.5 g/dL (ref 6.4–8.2)

## 2012-04-15 LAB — CBC WITH DIFFERENTIAL/PLATELET
Basophil #: 0.2 10*3/uL — ABNORMAL HIGH (ref 0.0–0.1)
Eosinophil #: 0.4 10*3/uL (ref 0.0–0.7)
HCT: 29.4 % — ABNORMAL LOW (ref 40.0–52.0)
HGB: 9 g/dL — ABNORMAL LOW (ref 13.0–18.0)
Lymphocyte #: 1.1 10*3/uL (ref 1.0–3.6)
Neutrophil #: 3.3 10*3/uL (ref 1.4–6.5)
RBC: 2.84 10*6/uL — ABNORMAL LOW (ref 4.40–5.90)
WBC: 6 10*3/uL (ref 3.8–10.6)

## 2012-04-19 LAB — CULTURE, BLOOD (SINGLE)

## 2012-04-21 ENCOUNTER — Ambulatory Visit: Payer: Medicare PPO | Admitting: Internal Medicine

## 2012-04-25 ENCOUNTER — Encounter: Payer: Self-pay | Admitting: Internal Medicine

## 2012-04-25 ENCOUNTER — Ambulatory Visit (INDEPENDENT_AMBULATORY_CARE_PROVIDER_SITE_OTHER): Payer: Medicare PPO | Admitting: Internal Medicine

## 2012-04-25 VITALS — BP 118/60 | HR 66 | Temp 97.7°F

## 2012-04-25 DIAGNOSIS — N186 End stage renal disease: Secondary | ICD-10-CM

## 2012-04-25 DIAGNOSIS — S81819A Laceration without foreign body, unspecified lower leg, initial encounter: Secondary | ICD-10-CM

## 2012-04-25 DIAGNOSIS — I82629 Acute embolism and thrombosis of deep veins of unspecified upper extremity: Secondary | ICD-10-CM

## 2012-04-25 DIAGNOSIS — S81009A Unspecified open wound, unspecified knee, initial encounter: Secondary | ICD-10-CM

## 2012-04-25 DIAGNOSIS — S81809A Unspecified open wound, unspecified lower leg, initial encounter: Secondary | ICD-10-CM

## 2012-04-25 NOTE — Progress Notes (Signed)
Subjective:    Patient ID: [name], male    DOB: February 07, 1946, 66 y.o.   MRN: [id number]  HPI Was rehospitalized after my last visit ?missed dialysis and was out of sorts Finally had full course of dialysis yesterday---first time in a while  Still on antibiotics for his leg Feels this may be making him tired  Stopped the coumadin temporarily Now should be back on it Started for non occlusive I-J thrombus Still with right arm swelling and in legs  Breathing is still hard Head is congested  Hasn't been looking at leg wound Current Outpatient Prescriptions on File Prior to Visit  Medication Sig Dispense Refill  . amLODipine (NORVASC) 10 MG tablet Take 10 mg by mouth daily.       Marland Kitchen aspirin 81 MG tablet Take 81 mg by mouth daily.      Marland Kitchen atorvastatin (LIPITOR) 80 MG tablet Take 80 mg by mouth daily.      . benazepril (LOTENSIN) 20 MG tablet Take 1 tablet (20 mg total) by mouth daily.  30 tablet  5  . finasteride (PROSCAR) 5 MG tablet Take 5 mg by mouth daily.        . folic acid (FOLVITE) 1 MG tablet Take 1 mg by mouth daily.      . furosemide (LASIX) 20 MG tablet Take 1 tablet (20 mg total) by mouth daily.  30 tablet  11  . gabapentin (NEURONTIN) 300 MG capsule Take 600 mg by mouth at bedtime.        Marland Kitchen glucose blood (FREESTYLE LITE) test strip Use to check blood sugar four times daily  100 each  12  . HYDROcodone-acetaminophen (NORCO) 5-325 MG per tablet Take 1 tablet by mouth every 4 (four) hours as needed.  40 tablet  0  . insulin glargine (LANTUS) 100 UNIT/ML injection Inject 40 Units into the skin at bedtime.        . Insulin Syringe-Needle U-100 31G X 5/16" 0.3 ML MISC Use to check blood sugar four times daily  100 each  12  . lansoprazole (PREVACID) 30 MG capsule Take 1 capsule (30 mg total) by mouth daily.  90 capsule  3  . levothyroxine (SYNTHROID, LEVOTHROID) 50 MCG tablet Take 1 tablet (50 mcg total) by mouth daily.  90 tablet  3  . metoprolol succinate (TOPROL-XL) 25 MG  24 hr tablet Take 25 mg by mouth daily.        Marland Kitchen NOVOLOG 100 UNIT/ML injection INJECT 40 UNITS INTO SKIN FOUR TIMES    A DAY  100 mL  11  . omeprazole (PRILOSEC) 20 MG capsule Take 20 mg by mouth daily.      . sennosides-docusate sodium (SENOKOT-S) 8.6-50 MG tablet Take 1 tablet by mouth daily.        Marland Kitchen warfarin (COUMADIN) 4 MG tablet Take 4 mg by mouth daily.        No Known Allergies  Past Medical History  Diagnosis Date  . CAD (coronary artery disease)   . Depression   . Diabetes mellitus   . GERD (gastroesophageal reflux disease)   . Thyroid disease   . BPH (benign prostatic hypertrophy)   . Renal insufficiency     creat 2.5-3.4  12/08    ESRD-dialysis started 2/10  . CVA (cerebral vascular accident)     hx of  2006--R hemiparesis  . Hypertension   . Anxiety   . Arthritis   . Basal ganglia infarction     2006--MRI/MRA/carotids  .  C. difficile colitis     with SIRS  . Cataract   . Chronic systolic heart failure   . Deep venous thrombosis of arm 8/13    Past Surgical History  Procedure Date  . Coronary artery bypass graft     Family History  Problem Relation Age of Onset  . Cancer Sister     breast    History   Social History  . Marital Status: Single    Spouse Name: N/A    Number of Children: 2  . Years of Education: N/A   Occupational History  . retired-disabled, Games developer    Social History Main Topics  . Smoking status: Former Smoker    Quit date: 07/02/2001  . Smokeless tobacco: Never Used  . Alcohol Use: Not on file  . Drug Use: No  . Sexually Active: Not on file   Other Topics Concern  . Not on file   Social History Narrative  . No narrative on file   Review of Systems Appetite is okay No chest pain    Objective:   Physical Exam  Constitutional: No distress.  Musculoskeletal: He exhibits edema.       3-4+ edema in extremities  Right arm swelling has improved  Skin:       ~8cm laceration in leg Granulation starting Easy  bleeding Staples and sutures removed Steri strips applied          Assessment & Plan:

## 2012-04-25 NOTE — Patient Instructions (Addendum)
Hold 2 days then decrease to 2 mg daily except 4 mg Tues, Thurs, Fri, recheck 1 week

## 2012-04-25 NOTE — Assessment & Plan Note (Signed)
Records reviewed Actually in right internal jugular Unclear if that caused the arm swelling but it is better Probably only should use 3 months of coumadin Would stop if any significant bleeding issues

## 2012-04-25 NOTE — Assessment & Plan Note (Signed)
Complex wound ---staples and stitches removed Dressing reapplied by CMA Finishing out antibiotics

## 2012-04-25 NOTE — Assessment & Plan Note (Signed)
Finally able to get full dialysis in yesterday Hopefully will feel better

## 2012-05-02 ENCOUNTER — Inpatient Hospital Stay: Payer: Self-pay | Admitting: Internal Medicine

## 2012-05-02 ENCOUNTER — Ambulatory Visit (INDEPENDENT_AMBULATORY_CARE_PROVIDER_SITE_OTHER): Payer: Medicare PPO | Admitting: Internal Medicine

## 2012-05-02 DIAGNOSIS — Z5181 Encounter for therapeutic drug level monitoring: Secondary | ICD-10-CM

## 2012-05-02 DIAGNOSIS — Z7901 Long term (current) use of anticoagulants: Secondary | ICD-10-CM

## 2012-05-02 DIAGNOSIS — I82409 Acute embolism and thrombosis of unspecified deep veins of unspecified lower extremity: Secondary | ICD-10-CM

## 2012-05-02 LAB — COMPREHENSIVE METABOLIC PANEL
Alkaline Phosphatase: 147 U/L — ABNORMAL HIGH (ref 50–136)
Anion Gap: 10 (ref 7–16)
Calcium, Total: 9.1 mg/dL (ref 8.5–10.1)
Co2: 23 mmol/L (ref 21–32)
EGFR (African American): 18 — ABNORMAL LOW
EGFR (Non-African Amer.): 16 — ABNORMAL LOW
Glucose: 604 mg/dL (ref 65–99)
Osmolality: 303 (ref 275–301)
Potassium: 4.9 mmol/L (ref 3.5–5.1)
SGOT(AST): 17 U/L (ref 15–37)
SGPT (ALT): 16 U/L (ref 12–78)
Sodium: 135 mmol/L — ABNORMAL LOW (ref 136–145)
Total Protein: 7.5 g/dL (ref 6.4–8.2)

## 2012-05-02 LAB — CBC
HCT: 34.2 % — ABNORMAL LOW (ref 40.0–52.0)
MCH: 34.1 pg — ABNORMAL HIGH (ref 26.0–34.0)
MCHC: 32.4 g/dL (ref 32.0–36.0)
MCV: 106 fL — ABNORMAL HIGH (ref 80–100)
Platelet: 239 10*3/uL (ref 150–440)
RBC: 3.25 10*6/uL — ABNORMAL LOW (ref 4.40–5.90)
RDW: 15 % — ABNORMAL HIGH (ref 11.5–14.5)

## 2012-05-02 LAB — URINALYSIS, COMPLETE
Bilirubin,UR: NEGATIVE
Protein: 100
RBC,UR: 13 /HPF (ref 0–5)
Specific Gravity: 1.016 (ref 1.003–1.030)
Squamous Epithelial: <1
WBC UR: 600 /HPF (ref 0–5)

## 2012-05-02 LAB — MAGNESIUM: Magnesium: 1.9 mg/dL

## 2012-05-02 NOTE — Patient Instructions (Signed)
Continue current dose, check in 4 weeks  

## 2012-05-03 LAB — BASIC METABOLIC PANEL
Anion Gap: 13 (ref 7–16)
BUN: 29 mg/dL — ABNORMAL HIGH (ref 7–18)
Calcium, Total: 8.4 mg/dL — ABNORMAL LOW (ref 8.5–10.1)
Chloride: 107 mmol/L (ref 98–107)
Co2: 22 mmol/L (ref 21–32)
EGFR (African American): 18 — ABNORMAL LOW
Osmolality: 302 (ref 275–301)
Potassium: 4 mmol/L (ref 3.5–5.1)
Sodium: 142 mmol/L (ref 136–145)

## 2012-05-03 LAB — CBC WITH DIFFERENTIAL/PLATELET
Basophil #: 0.1 10*3/uL (ref 0.0–0.1)
Basophil %: 1.3 %
Lymphocyte %: 18.7 %
MCH: 32.8 pg (ref 26.0–34.0)
MCHC: 32.4 g/dL (ref 32.0–36.0)
Monocyte #: 0.7 x10 3/mm (ref 0.2–1.0)
Monocyte %: 13.9 %
Neutrophil %: 64.9 %
Platelet: 209 10*3/uL (ref 150–440)
RDW: 14.9 % — ABNORMAL HIGH (ref 11.5–14.5)
WBC: 5.1 10*3/uL (ref 3.8–10.6)

## 2012-05-03 LAB — PHOSPHORUS: Phosphorus: 3.7 mg/dL (ref 2.5–4.9)

## 2012-05-03 LAB — HEMOGLOBIN A1C: Hemoglobin A1C: 7.9 % — ABNORMAL HIGH (ref 4.2–6.3)

## 2012-05-08 ENCOUNTER — Emergency Department: Payer: Self-pay | Admitting: Emergency Medicine

## 2012-05-08 LAB — BASIC METABOLIC PANEL
Anion Gap: 10 (ref 7–16)
BUN: 12 mg/dL (ref 7–18)
Calcium, Total: 8.6 mg/dL (ref 8.5–10.1)
Chloride: 98 mmol/L (ref 98–107)
Creatinine: 2.61 mg/dL — ABNORMAL HIGH (ref 0.60–1.30)
Glucose: 132 mg/dL — ABNORMAL HIGH (ref 65–99)
Osmolality: 277 (ref 275–301)
Potassium: 4 mmol/L (ref 3.5–5.1)
Sodium: 138 mmol/L (ref 136–145)

## 2012-05-08 LAB — CBC
HCT: 33.5 % — ABNORMAL LOW (ref 40.0–52.0)
MCHC: 32.8 g/dL (ref 32.0–36.0)
MCV: 100 fL (ref 80–100)
Platelet: 214 10*3/uL (ref 150–440)
RDW: 14.3 % (ref 11.5–14.5)
WBC: 7.3 10*3/uL (ref 3.8–10.6)

## 2012-05-09 ENCOUNTER — Encounter: Payer: Self-pay | Admitting: Internal Medicine

## 2012-05-09 ENCOUNTER — Ambulatory Visit (INDEPENDENT_AMBULATORY_CARE_PROVIDER_SITE_OTHER): Payer: Medicare PPO | Admitting: Internal Medicine

## 2012-05-09 VITALS — BP 138/80 | HR 56 | Temp 98.0°F

## 2012-05-09 DIAGNOSIS — L97909 Non-pressure chronic ulcer of unspecified part of unspecified lower leg with unspecified severity: Secondary | ICD-10-CM

## 2012-05-09 DIAGNOSIS — L97209 Non-pressure chronic ulcer of unspecified calf with unspecified severity: Secondary | ICD-10-CM

## 2012-05-09 NOTE — Progress Notes (Signed)
Subjective:    Patient ID: [name], male    DOB: Apr 24, 1946, 66 y.o.   MRN: [id number]  HPI [name] to ER yesterday due to "couldn't breathe" Diagnosed with anxiety from the dialysis Was diagnosed with infection in leg  Has had home health just weekly for the wound care  No fever Rx for clindamycin but he still has the prescription Unable to hold his leg too high when lying back--causes dyspnea  Current Outpatient Prescriptions on File Prior to Visit  Medication Sig Dispense Refill  . amLODipine (NORVASC) 10 MG tablet Take 10 mg by mouth daily.       Marland Kitchen aspirin 81 MG tablet Take 81 mg by mouth daily.      Marland Kitchen atorvastatin (LIPITOR) 80 MG tablet Take 80 mg by mouth daily.      . benazepril (LOTENSIN) 20 MG tablet Take 1 tablet (20 mg total) by mouth daily.  30 tablet  5  . finasteride (PROSCAR) 5 MG tablet Take 5 mg by mouth daily.        . folic acid (FOLVITE) 1 MG tablet Take 1 mg by mouth daily.      . furosemide (LASIX) 20 MG tablet Take 1 tablet (20 mg total) by mouth daily.  30 tablet  11  . gabapentin (NEURONTIN) 300 MG capsule Take 600 mg by mouth at bedtime.        Marland Kitchen glucose blood (FREESTYLE LITE) test strip Use to check blood sugar four times daily  100 each  12  . HYDROcodone-acetaminophen (NORCO) 5-325 MG per tablet Take 1 tablet by mouth every 4 (four) hours as needed.  40 tablet  0  . insulin glargine (LANTUS) 100 UNIT/ML injection Inject 40 Units into the skin at bedtime.        . Insulin Syringe-Needle U-100 31G X 5/16" 0.3 ML MISC Use to check blood sugar four times daily  100 each  12  . lansoprazole (PREVACID) 30 MG capsule Take 1 capsule (30 mg total) by mouth daily.  90 capsule  3  . levothyroxine (SYNTHROID, LEVOTHROID) 50 MCG tablet Take 1 tablet (50 mcg total) by mouth daily.  90 tablet  3  . metoprolol succinate (TOPROL-XL) 25 MG 24 hr tablet Take 25 mg by mouth daily.        Marland Kitchen NOVOLOG 100 UNIT/ML injection INJECT 40 UNITS INTO SKIN FOUR TIMES    A DAY  100 mL   11  . omeprazole (PRILOSEC) 20 MG capsule Take 20 mg by mouth daily.      . sennosides-docusate sodium (SENOKOT-S) 8.6-50 MG tablet Take 1 tablet by mouth daily.        Marland Kitchen warfarin (COUMADIN) 4 MG tablet Take 4 mg by mouth daily.        No Known Allergies  Past Medical History  Diagnosis Date  . CAD (coronary artery disease)   . Depression   . Diabetes mellitus   . GERD (gastroesophageal reflux disease)   . Thyroid disease   . BPH (benign prostatic hypertrophy)   . Renal insufficiency     creat 2.5-3.4  12/08    ESRD-dialysis started 2/10  . CVA (cerebral vascular accident)     hx of  2006--R hemiparesis  . Hypertension   . Anxiety   . Arthritis   . Basal ganglia infarction     2006--MRI/MRA/carotids  . C. difficile colitis     with SIRS  . Cataract   . Chronic systolic heart failure   .  Deep venous thrombosis of arm 8/13    Past Surgical History  Procedure Date  . Coronary artery bypass graft     Family History  Problem Relation Age of Onset  . Cancer Sister     breast    History   Social History  . Marital Status: Single    Spouse Name: N/A    Number of Children: 2  . Years of Education: N/A   Occupational History  . retired-disabled, Games developer    Social History Main Topics  . Smoking status: Former Smoker    Quit date: 07/02/2001  . Smokeless tobacco: Never Used  . Alcohol Use: Not on file  . Drug Use: No  . Sexually Active: Not on file   Other Topics Concern  . Not on file   Social History Narrative  . No narrative on file   Review of Systems Eating okay No nausea     Objective:   Physical Exam  Musculoskeletal:       Chronic edema Redness which is chronic by right toes and forefoot Not inflamed  Laceration on distal right calf has some granulation No sig inflammation, warmth or tenderness          Assessment & Plan:

## 2012-05-09 NOTE — Assessment & Plan Note (Addendum)
From laceration  Has ~2cm wide wound which really doesn't look infected Told him to hold off on the clindamycin  I redressed the wound with silvadene, gauze, cling and coban  Friend is doing this on days without nurse coming Discussed how to do this

## 2012-05-20 ENCOUNTER — Inpatient Hospital Stay: Payer: Self-pay | Admitting: Internal Medicine

## 2012-05-20 LAB — COMPREHENSIVE METABOLIC PANEL
Alkaline Phosphatase: 179 U/L — ABNORMAL HIGH (ref 50–136)
Calcium, Total: 8.8 mg/dL (ref 8.5–10.1)
Co2: 23 mmol/L (ref 21–32)
Creatinine: 5 mg/dL — ABNORMAL HIGH (ref 0.60–1.30)
EGFR (African American): 13 — ABNORMAL LOW
EGFR (Non-African Amer.): 11 — ABNORMAL LOW
Glucose: 88 mg/dL (ref 65–99)
SGPT (ALT): 36 U/L (ref 12–78)

## 2012-05-20 LAB — PHOSPHORUS: Phosphorus: 7.4 mg/dL — ABNORMAL HIGH (ref 2.5–4.9)

## 2012-05-20 LAB — CBC
HCT: 32.2 % — ABNORMAL LOW (ref 40.0–52.0)
MCH: 32.1 pg (ref 26.0–34.0)
MCHC: 32.2 g/dL (ref 32.0–36.0)
MCV: 100 fL (ref 80–100)
RDW: 14.3 % (ref 11.5–14.5)

## 2012-05-21 LAB — BASIC METABOLIC PANEL
Anion Gap: 10 (ref 7–16)
Calcium, Total: 8.5 mg/dL (ref 8.5–10.1)
Co2: 26 mmol/L (ref 21–32)
EGFR (African American): 18 — ABNORMAL LOW
EGFR (Non-African Amer.): 16 — ABNORMAL LOW
Osmolality: 285 (ref 275–301)

## 2012-05-21 LAB — CBC WITH DIFFERENTIAL/PLATELET
Basophil #: 0.1 10*3/uL (ref 0.0–0.1)
Basophil %: 0.9 %
Eosinophil #: 0.1 10*3/uL (ref 0.0–0.7)
HCT: 32.2 % — ABNORMAL LOW (ref 40.0–52.0)
Lymphocyte #: 1.1 10*3/uL (ref 1.0–3.6)
Lymphocyte %: 16.5 %
MCH: 32.4 pg (ref 26.0–34.0)
MCHC: 32.8 g/dL (ref 32.0–36.0)
MCV: 99 fL (ref 80–100)
Monocyte %: 15 %
Neutrophil #: 4.3 10*3/uL (ref 1.4–6.5)
RBC: 3.25 10*6/uL — ABNORMAL LOW (ref 4.40–5.90)
RDW: 14.6 % — ABNORMAL HIGH (ref 11.5–14.5)
WBC: 6.6 10*3/uL (ref 3.8–10.6)

## 2012-05-22 ENCOUNTER — Telehealth: Payer: Self-pay

## 2012-05-22 NOTE — Telephone Encounter (Signed)
They should be able to get that information from the discharge planner at [facility] can tell who they referred to

## 2012-05-22 NOTE — Telephone Encounter (Signed)
Pt discharged from [facility] today; pt has uniboots on both legs; family said uni boots put on  by home health, not sure of name of home health agency. Lupita Leash request name of agency or can pt be seen prior to f/u appt  With Dr Letvak10/30/13.Please advise.

## 2012-05-23 NOTE — Telephone Encounter (Signed)
Left message to have [name] return my call

## 2012-05-24 LAB — STOOL CULTURE

## 2012-05-26 NOTE — Telephone Encounter (Signed)
Spoke with wound care and they are not sure why someone called the office, they have that information.

## 2012-05-30 ENCOUNTER — Ambulatory Visit: Payer: Medicare PPO

## 2012-05-31 ENCOUNTER — Ambulatory Visit: Payer: Medicare PPO | Admitting: Internal Medicine

## 2012-05-31 ENCOUNTER — Telehealth: Payer: Self-pay | Admitting: *Deleted

## 2012-05-31 NOTE — Telephone Encounter (Signed)
Called pt's care giver Lavone Nian about the appointments patient has. She states pt just came home this morning from [facility], they think he had a stroke but couldn't confirm because he wouldn't let them do the MRI. Patient also didn't go to dialysis this morning. Per caregiver he's not doing anything they ask of him. He will not be coming to the appt today at 10:30am, which I will cancel. I asked if he would be coming to his appt next week 06/06/2012 and she said she didn't know, it depends on him and his mood and how he's feeling.  She's asking for a refill of his lorazepam. Please advise

## 2012-05-31 NOTE — Telephone Encounter (Signed)
This wasn't even on his list and we had a note about it for before dialysis in the past but I don't think I have filled it  We will need to discuss this at his next appt He should keep next week's appt but check then to be sure he doesn't wind up as a no-show again

## 2012-06-02 ENCOUNTER — Emergency Department: Payer: Self-pay | Admitting: Emergency Medicine

## 2012-06-02 ENCOUNTER — Ambulatory Visit: Payer: Self-pay | Admitting: Internal Medicine

## 2012-06-03 ENCOUNTER — Inpatient Hospital Stay: Payer: Self-pay | Admitting: Internal Medicine

## 2012-06-03 LAB — CBC WITH DIFFERENTIAL/PLATELET
Basophil #: 0 10*3/uL (ref 0.0–0.1)
Basophil %: 0.8 %
Eosinophil #: 0.1 10*3/uL (ref 0.0–0.7)
Eosinophil %: 2.3 %
HCT: 33.1 % — ABNORMAL LOW (ref 40.0–52.0)
HGB: 10.4 g/dL — ABNORMAL LOW (ref 13.0–18.0)
Lymphocyte %: 13.6 %
MCH: 32 pg (ref 26.0–34.0)
MCHC: 31.5 g/dL — ABNORMAL LOW (ref 32.0–36.0)
Monocyte #: 0.8 x10 3/mm (ref 0.2–1.0)
Monocyte %: 13.8 %
Neutrophil #: 4.1 10*3/uL (ref 1.4–6.5)
Neutrophil %: 69.5 %
RBC: 3.26 10*6/uL — ABNORMAL LOW (ref 4.40–5.90)
RDW: 15.9 % — ABNORMAL HIGH (ref 11.5–14.5)

## 2012-06-03 LAB — RENAL FUNCTION PANEL
Albumin: 3.2 g/dL — ABNORMAL LOW (ref 3.4–5.0)
Anion Gap: 18 — ABNORMAL HIGH (ref 7–16)
BUN: 66 mg/dL — ABNORMAL HIGH (ref 7–18)
Creatinine: 4.81 mg/dL — ABNORMAL HIGH (ref 0.60–1.30)
EGFR (African American): 13 — ABNORMAL LOW
EGFR (Non-African Amer.): 12 — ABNORMAL LOW
Glucose: 488 mg/dL — ABNORMAL HIGH (ref 65–99)
Osmolality: 303 (ref 275–301)
Phosphorus: 5.9 mg/dL — ABNORMAL HIGH (ref 2.5–4.9)

## 2012-06-04 LAB — BASIC METABOLIC PANEL
Anion Gap: 9 (ref 7–16)
BUN: 33 mg/dL — ABNORMAL HIGH (ref 7–18)
Chloride: 102 mmol/L (ref 98–107)
Co2: 29 mmol/L (ref 21–32)
Creatinine: 3.13 mg/dL — ABNORMAL HIGH (ref 0.60–1.30)
EGFR (Non-African Amer.): 19 — ABNORMAL LOW
Glucose: 73 mg/dL (ref 65–99)
Osmolality: 285 (ref 275–301)
Potassium: 4.1 mmol/L (ref 3.5–5.1)
Sodium: 140 mmol/L (ref 136–145)

## 2012-06-05 LAB — CBC WITH DIFFERENTIAL/PLATELET
Basophil #: 0.1 10*3/uL (ref 0.0–0.1)
Basophil %: 1.2 %
Eosinophil #: 0.2 10*3/uL (ref 0.0–0.7)
Eosinophil %: 3.1 %
HGB: 10.3 g/dL — ABNORMAL LOW (ref 13.0–18.0)
Lymphocyte #: 0.9 10*3/uL — ABNORMAL LOW (ref 1.0–3.6)
Lymphocyte %: 16.1 %
MCH: 32.3 pg (ref 26.0–34.0)
MCHC: 33 g/dL (ref 32.0–36.0)
MCV: 98 fL (ref 80–100)
Monocyte #: 0.6 x10 3/mm (ref 0.2–1.0)
Neutrophil #: 3.6 10*3/uL (ref 1.4–6.5)
Neutrophil %: 67.7 %
Platelet: 124 10*3/uL — ABNORMAL LOW (ref 150–440)
RBC: 3.19 10*6/uL — ABNORMAL LOW (ref 4.40–5.90)
RDW: 15.3 % — ABNORMAL HIGH (ref 11.5–14.5)

## 2012-06-05 LAB — RENAL FUNCTION PANEL
Albumin: 2.9 g/dL — ABNORMAL LOW (ref 3.4–5.0)
Anion Gap: 11 (ref 7–16)
BUN: 40 mg/dL — ABNORMAL HIGH (ref 7–18)
Chloride: 99 mmol/L (ref 98–107)
Co2: 25 mmol/L (ref 21–32)
Creatinine: 3.62 mg/dL — ABNORMAL HIGH (ref 0.60–1.30)
EGFR (Non-African Amer.): 16 — ABNORMAL LOW
Osmolality: 288 (ref 275–301)
Phosphorus: 3.8 mg/dL (ref 2.5–4.9)

## 2012-06-06 ENCOUNTER — Ambulatory Visit: Payer: Medicare PPO | Admitting: Internal Medicine

## 2012-06-08 ENCOUNTER — Emergency Department: Payer: Self-pay | Admitting: Emergency Medicine

## 2012-06-08 ENCOUNTER — Other Ambulatory Visit: Payer: Self-pay

## 2012-06-08 MED ORDER — LORAZEPAM 0.5 MG PO TABS: ORAL_TABLET | ORAL | Status: AC

## 2012-06-08 NOTE — Telephone Encounter (Signed)
rx called into pharmacy

## 2012-06-08 NOTE — Telephone Encounter (Signed)
Okay #60 x 0 

## 2012-06-08 NOTE — Telephone Encounter (Signed)
Recently discharged from [facility], pt out of Lorazepam and needs before has dialysis on 06/09/12. Please call pt's home after filled [phone].Please advise.

## 2012-06-09 LAB — URINALYSIS, COMPLETE
Bacteria: NONE SEEN
Bilirubin,UR: NEGATIVE
Glucose,UR: >=500 mg/dL (ref 0–75)
Nitrite: NEGATIVE
Specific Gravity: 1.011 (ref 1.003–1.030)
WBC UR: 291 /HPF (ref 0–5)

## 2012-06-13 ENCOUNTER — Ambulatory Visit: Payer: Medicare PPO | Admitting: Internal Medicine

## 2012-06-14 ENCOUNTER — Ambulatory Visit: Payer: Medicare PPO | Admitting: Internal Medicine

## 2012-06-15 ENCOUNTER — Other Ambulatory Visit: Payer: Self-pay | Admitting: *Deleted

## 2012-06-15 MED ORDER — HYDROCODONE-ACETAMINOPHEN 5-500 MG PO TABS: 1.0000 | ORAL_TABLET | Freq: Four times a day (QID) | ORAL | Status: AC | PRN

## 2012-06-15 NOTE — Telephone Encounter (Signed)
rx called into pharmacy Spoke with patient's caregiver and advised results about med increase

## 2012-06-15 NOTE — Telephone Encounter (Signed)
Received a faxed refill request for pt's lorazepam 0.5 mg with a notation on it that pt is requesting a stronger strength.  received an additional request for to refill hydrocodone/apap 5-500 mg tabs to take one tablet four times a day if needed for pain # 20 written by Dr Daryel November @ [facility]

## 2012-06-15 NOTE — Telephone Encounter (Signed)
Okay to refill the hydrocodone for #20 x 0  i need to discuss any increase in lorazepam in person at his next appt

## 2012-06-20 ENCOUNTER — Ambulatory Visit: Payer: Medicare PPO | Admitting: Internal Medicine

## 2012-06-20 ENCOUNTER — Inpatient Hospital Stay: Payer: Self-pay | Admitting: Internal Medicine

## 2012-06-20 LAB — COMPREHENSIVE METABOLIC PANEL
Albumin: 3.2 g/dL — ABNORMAL LOW (ref 3.4–5.0)
Alkaline Phosphatase: 152 U/L — ABNORMAL HIGH (ref 50–136)
Anion Gap: 13 (ref 7–16)
Calcium, Total: 8.7 mg/dL (ref 8.5–10.1)
EGFR (Non-African Amer.): 10 — ABNORMAL LOW
Glucose: 26 mg/dL — CL (ref 65–99)
SGOT(AST): 13 U/L — ABNORMAL LOW (ref 15–37)
SGPT (ALT): 15 U/L (ref 12–78)

## 2012-06-20 LAB — URINALYSIS, COMPLETE
Bilirubin,UR: NEGATIVE
Glucose,UR: NEGATIVE mg/dL (ref 0–75)
Hyaline Cast: 15
Ketone: NEGATIVE
Ph: 5 (ref 4.5–8.0)
Protein: 100
RBC,UR: 53 /HPF (ref 0–5)

## 2012-06-20 LAB — CBC
MCH: 31.6 pg (ref 26.0–34.0)
MCHC: 32.4 g/dL (ref 32.0–36.0)
MCV: 97 fL (ref 80–100)
Platelet: 169 10*3/uL (ref 150–440)
RBC: 3.69 10*6/uL — ABNORMAL LOW (ref 4.40–5.90)
RDW: 15.4 % — ABNORMAL HIGH (ref 11.5–14.5)
WBC: 6.2 10*3/uL (ref 3.8–10.6)

## 2012-06-20 LAB — CK TOTAL AND CKMB (NOT AT ARMC)
CK, Total: 127 U/L (ref 35–232)
CK, Total: 94 U/L (ref 35–232)
CK-MB: 4.9 ng/mL — ABNORMAL HIGH (ref 0.5–3.6)

## 2012-06-21 LAB — CBC WITH DIFFERENTIAL/PLATELET
Basophil #: 0 10*3/uL (ref 0.0–0.1)
Eosinophil #: 0.2 10*3/uL (ref 0.0–0.7)
Eosinophil %: 5.5 %
HCT: 30.3 % — ABNORMAL LOW (ref 40.0–52.0)
HGB: 9.8 g/dL — ABNORMAL LOW (ref 13.0–18.0)
Lymphocyte #: 0.9 10*3/uL — ABNORMAL LOW (ref 1.0–3.6)
MCHC: 32.4 g/dL (ref 32.0–36.0)
MCV: 98 fL (ref 80–100)
Monocyte #: 0.5 x10 3/mm (ref 0.2–1.0)
Monocyte %: 11.6 %
Neutrophil #: 2.4 10*3/uL (ref 1.4–6.5)
Platelet: 128 10*3/uL — ABNORMAL LOW (ref 150–440)
RDW: 15.5 % — ABNORMAL HIGH (ref 11.5–14.5)

## 2012-06-21 LAB — CK TOTAL AND CKMB (NOT AT ARMC): CK, Total: 90 U/L (ref 35–232)

## 2012-06-21 LAB — BASIC METABOLIC PANEL
BUN: 53 mg/dL — ABNORMAL HIGH (ref 7–18)
Calcium, Total: 7.5 mg/dL — ABNORMAL LOW (ref 8.5–10.1)
Chloride: 98 mmol/L (ref 98–107)
Co2: 22 mmol/L (ref 21–32)
EGFR (African American): 12 — ABNORMAL LOW
EGFR (Non-African Amer.): 10 — ABNORMAL LOW
Osmolality: 280 (ref 275–301)
Sodium: 132 mmol/L — ABNORMAL LOW (ref 136–145)

## 2012-06-23 LAB — PHOSPHORUS: Phosphorus: 5.8 mg/dL — ABNORMAL HIGH (ref 2.5–4.9)

## 2012-06-25 LAB — WBC: WBC: 4.8 10*3/uL (ref 3.8–10.6)

## 2012-06-25 LAB — HEMOGLOBIN: HGB: 11 g/dL — ABNORMAL LOW (ref 13.0–18.0)

## 2012-06-26 LAB — RENAL FUNCTION PANEL
Albumin: 2.7 g/dL — ABNORMAL LOW (ref 3.4–5.0)
Anion Gap: 8 (ref 7–16)
BUN: 27 mg/dL — ABNORMAL HIGH (ref 7–18)
Calcium, Total: 9.2 mg/dL (ref 8.5–10.1)
Chloride: 98 mmol/L (ref 98–107)
Creatinine: 4.1 mg/dL — ABNORMAL HIGH (ref 0.60–1.30)
EGFR (African American): 16 — ABNORMAL LOW
EGFR (Non-African Amer.): 14 — ABNORMAL LOW
Glucose: 109 mg/dL — ABNORMAL HIGH (ref 65–99)
Osmolality: 272 (ref 275–301)

## 2012-06-26 LAB — CULTURE, BLOOD (SINGLE)

## 2012-06-26 LAB — PLATELET COUNT: Platelet: 145 10*3/uL — ABNORMAL LOW (ref 150–440)

## 2012-06-26 LAB — URINE CULTURE

## 2012-07-02 ENCOUNTER — Ambulatory Visit: Payer: Self-pay | Admitting: Internal Medicine

## 2012-07-03 ENCOUNTER — Telehealth: Payer: Self-pay | Admitting: *Deleted

## 2012-07-03 NOTE — Telephone Encounter (Signed)
That is a good idea for him  Okay to fax my recent notes

## 2012-07-03 NOTE — Telephone Encounter (Signed)
Faxed office note to [place] at [facility]

## 2012-07-03 NOTE — Telephone Encounter (Signed)
Call from [place] at [facility], she received call asking for hospice services for patient, if so she would need a office notes faxed to her at [phone]. Please advise

## 2012-07-07 ENCOUNTER — Telehealth: Payer: Self-pay

## 2012-07-07 NOTE — Telephone Encounter (Signed)
[name] with [facility];Pt expired 2012-07-14 at 10:10 am at [facility]. Any questions contact [name].

## 2012-07-14 NOTE — Telephone Encounter (Signed)
Dr.Letvak is aware

## 2012-08-02 DEATH — deceased

## 2012-08-16 ENCOUNTER — Ambulatory Visit (INDEPENDENT_AMBULATORY_CARE_PROVIDER_SITE_OTHER): Payer: Medicare PPO | Admitting: General Practice

## 2012-08-16 NOTE — Patient Instructions (Signed)
Pt expired.

## 2014-01-22 IMAGING — XA IR VASCULAR PROCEDURE
6 of 7 series · 15 of 24 positions shown · IV contrast (IODINE)
Comparison: none

[Series 1: care upper arm · 4 acquisitions, 2 frames shown (1 of 6)]
[im 1/4]
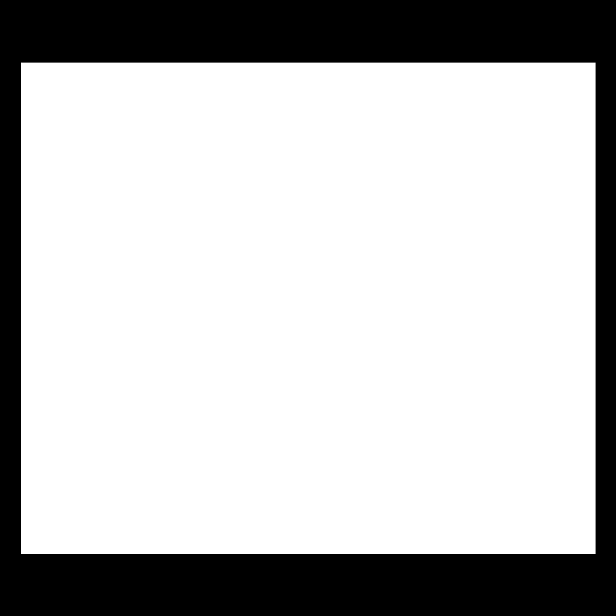
[im 2/4]
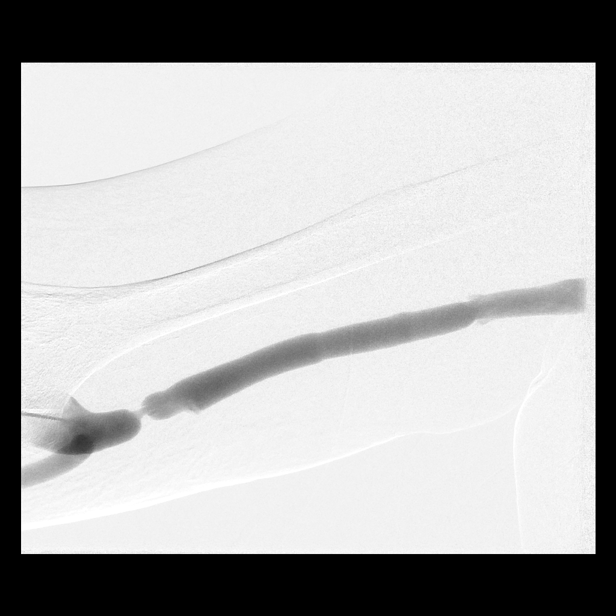

[Series 2: care upper arm · 3 acquisitions, 2 frames shown (2 of 6)]
[im 1/3]
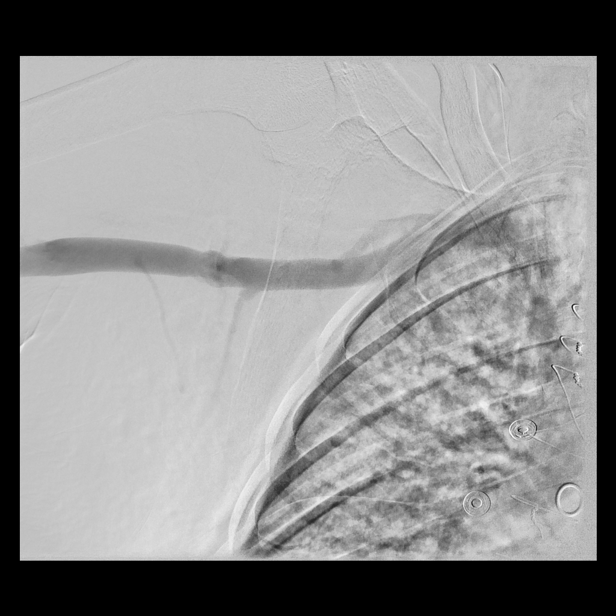
[im 1/3]
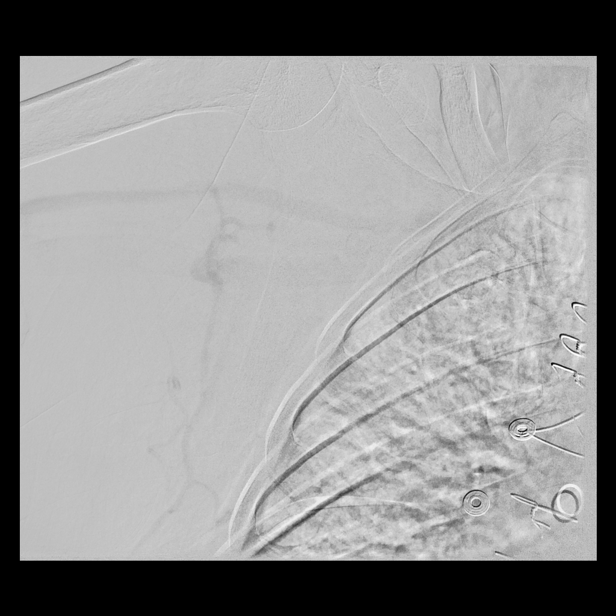

[Series 3: care upper arm · 6 acquisitions, 3 frames shown (3 of 6)]
[im 1/6]
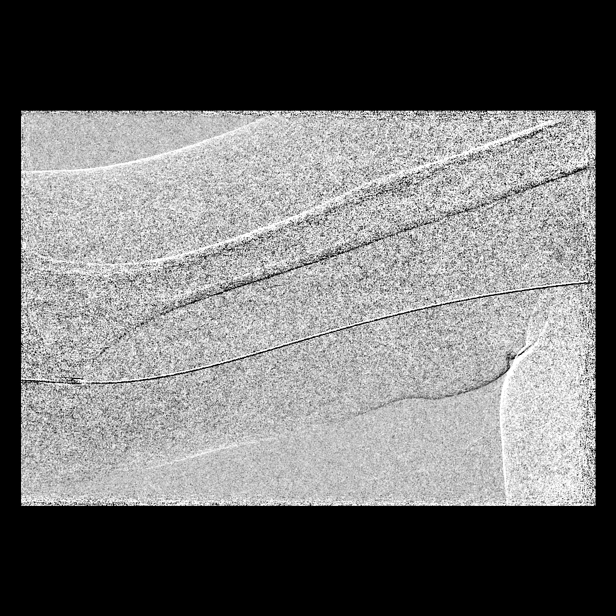
[im 3/6]
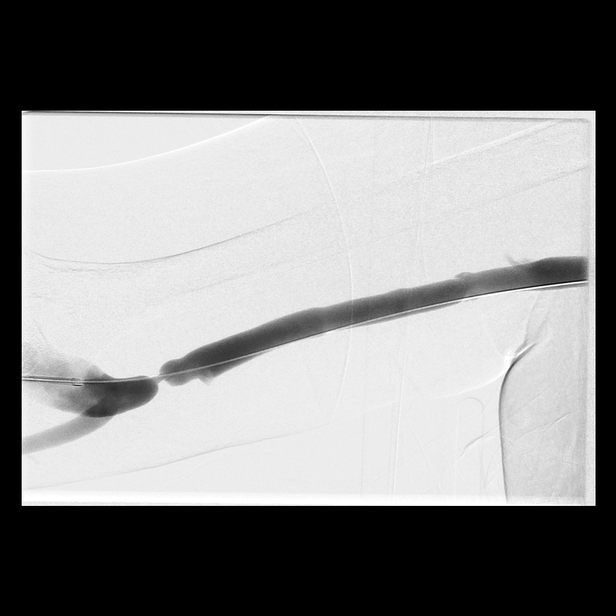
[im 6/6]
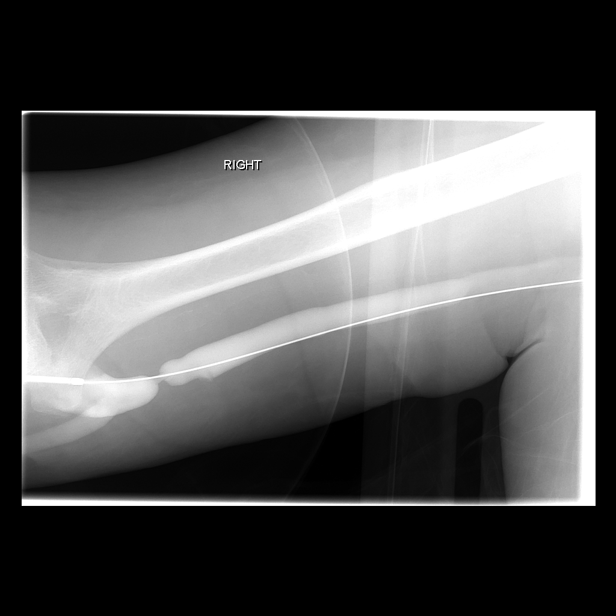

[Series 5: care upper arm · 4 acquisitions, 3 frames shown (4 of 6)]
[im 1/4]
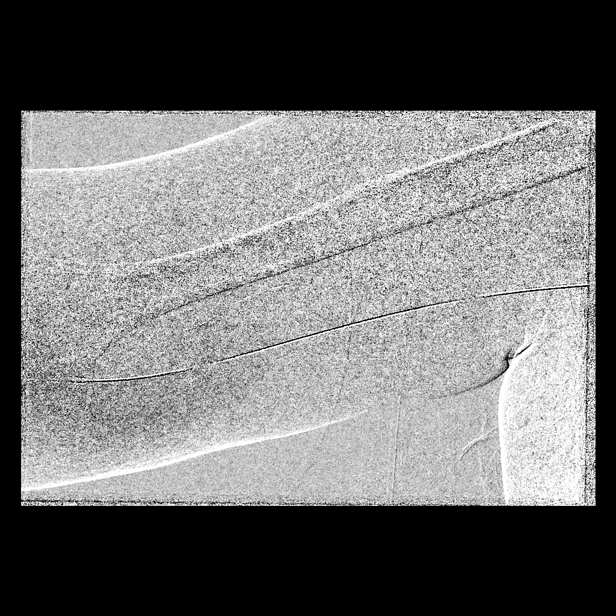
[im 1/4]
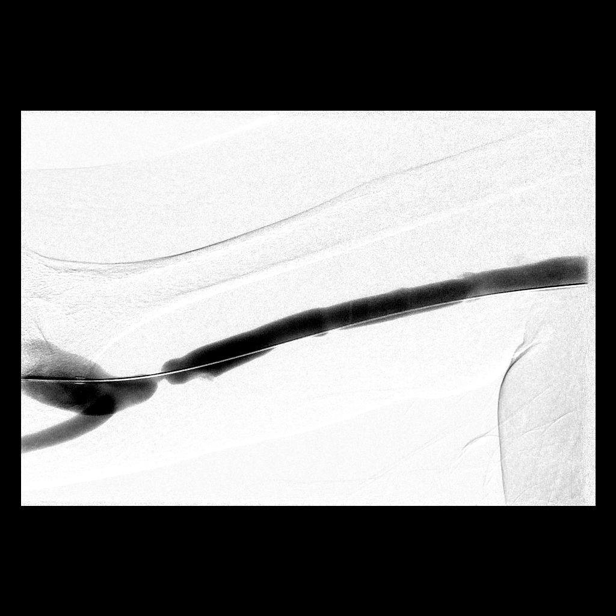
[im 3/4]
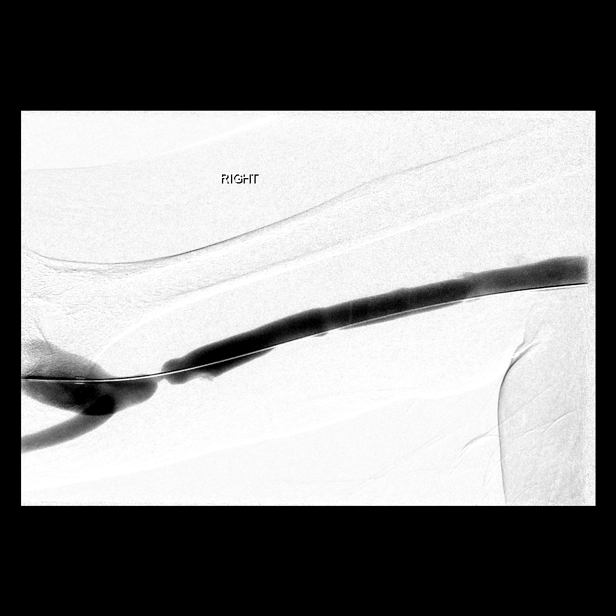

[Series 6: care upper arm · 3 acquisitions, 2 frames shown (5 of 6)]
[im 1/3]
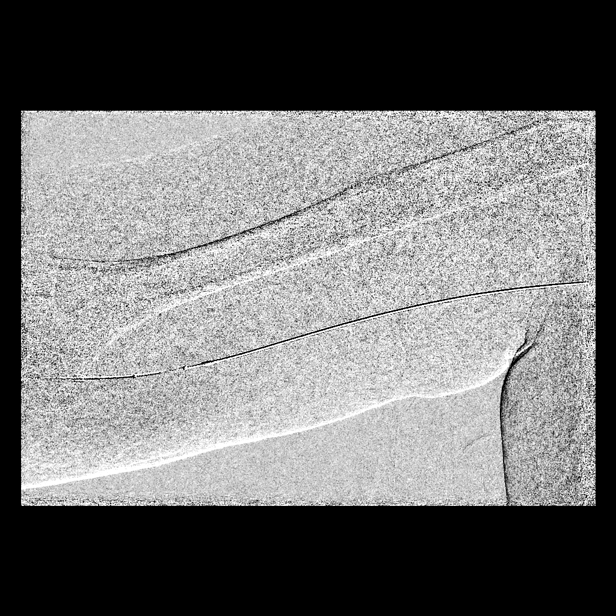
[im 2/3]
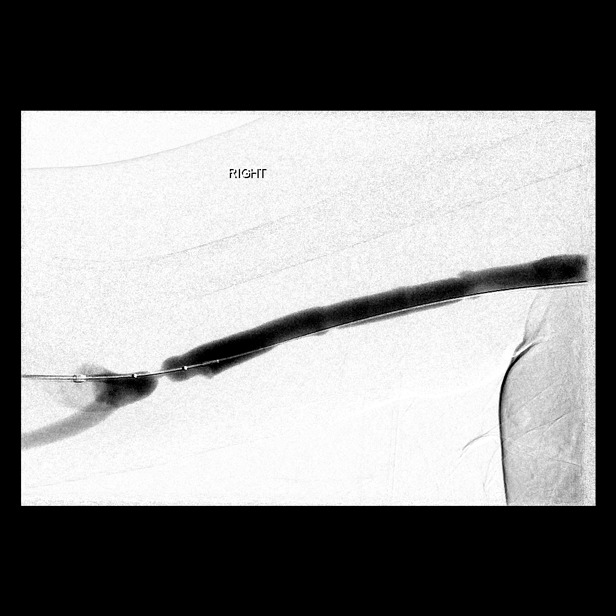

[Series 7: care upper arm · 4 acquisitions, 3 frames shown (6 of 6)]
[im 1/4]
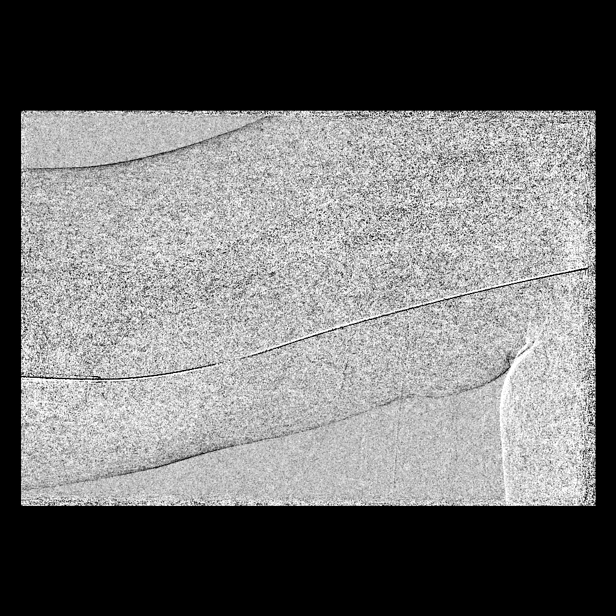
[im 1/4]
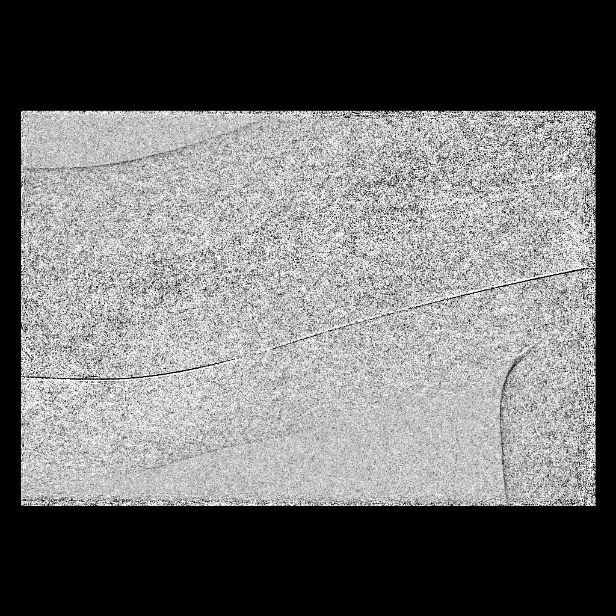
[im 4/4]
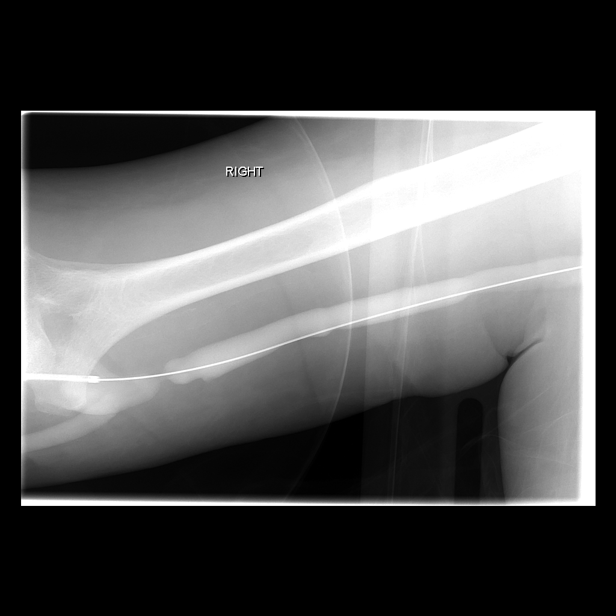

[15 of 24 positions shown; findings below may reference images not displayed]

IMAGES IMPORTED FROM THE SYNGO WORKFLOW SYSTEM
NO DICTATION FOR STUDY

## 2014-02-20 IMAGING — US US EXTREM LOW VENOUS*R*
1 series · 14 of 24 positions shown · non-contrast
Comparison: none

REASON FOR EXAM: red swollen leg
COMMENTS:

PROCEDURE:     US  - US DOPPLER LOW EXTR RIGHT  - April 14, 2012 [DATE]
RESULT:     Comparison: None

[Series 1: us extrem low venous*right* · 0.09mm/px · 14 of 24 slices shown]
[im 1/24]
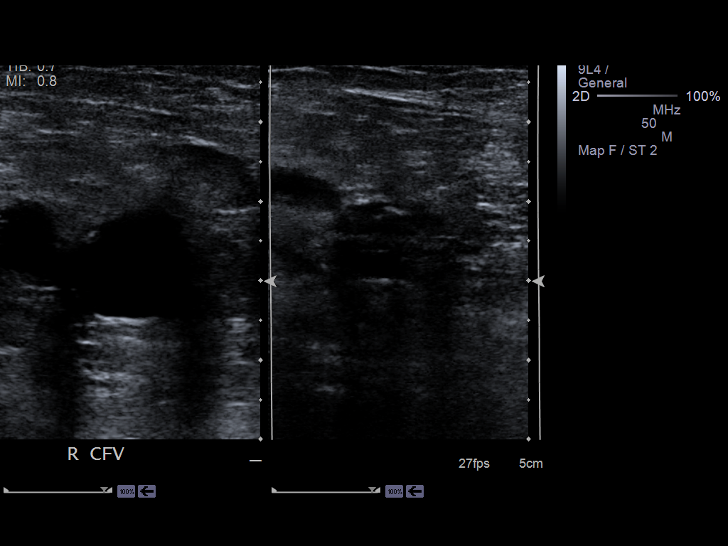
[im 3/24]
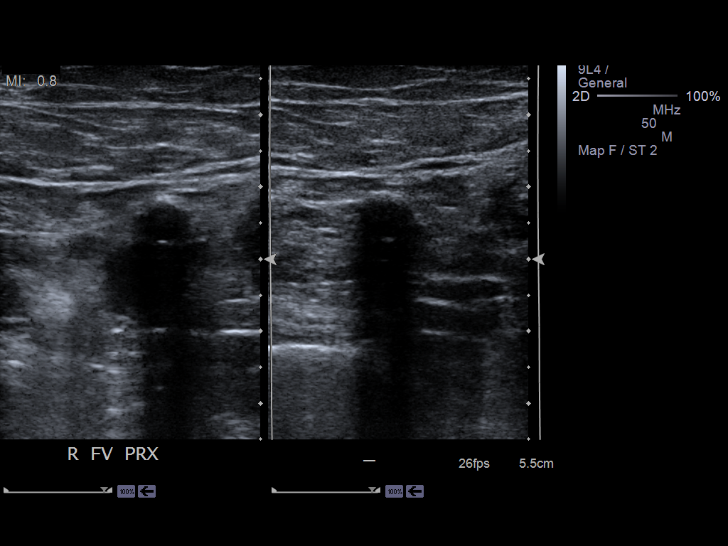
[im 5/24]
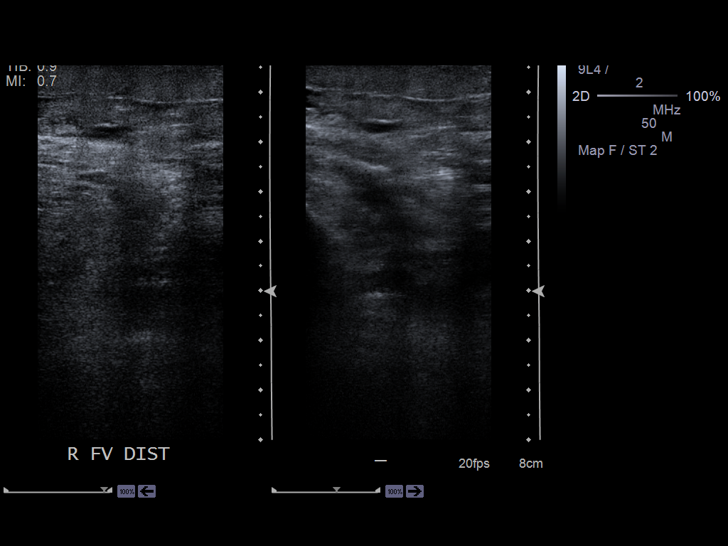
[im 7/24]
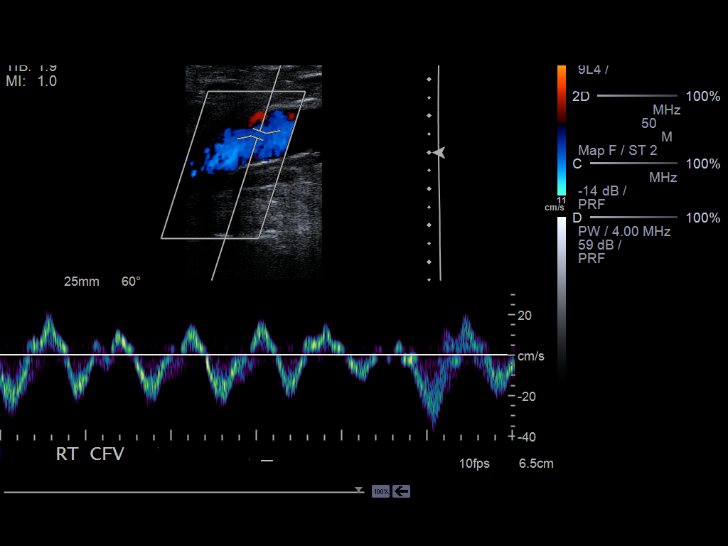
[im 8/24]
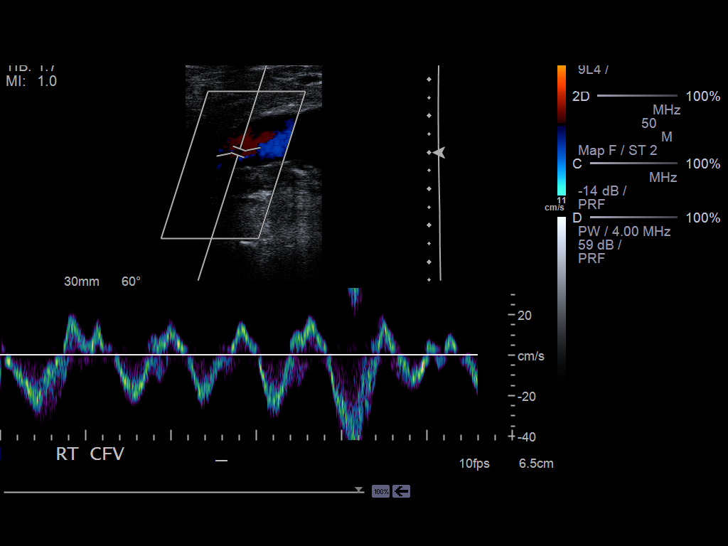
[im 10/24]
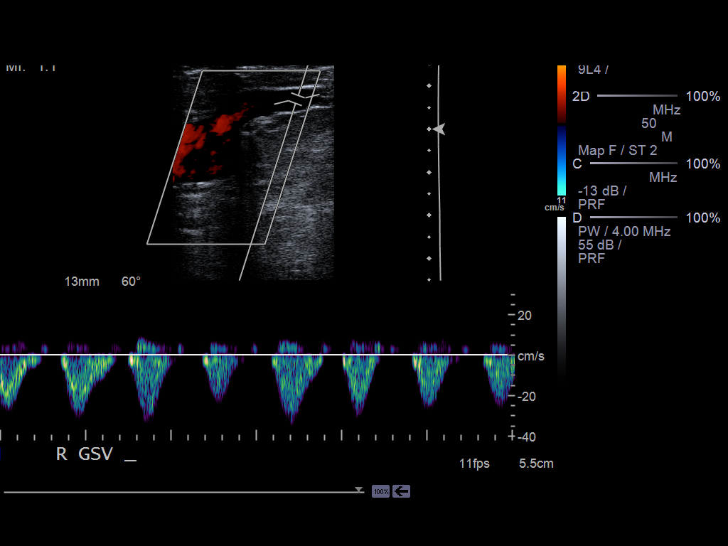
[im 12/24]
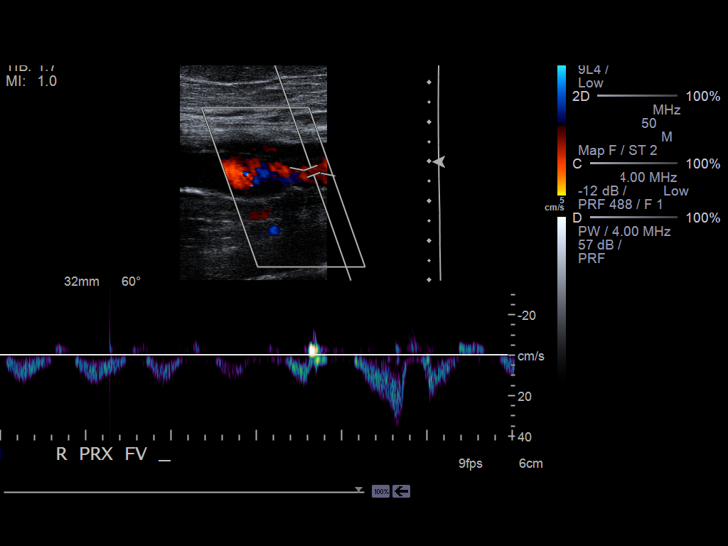
[im 13/24]
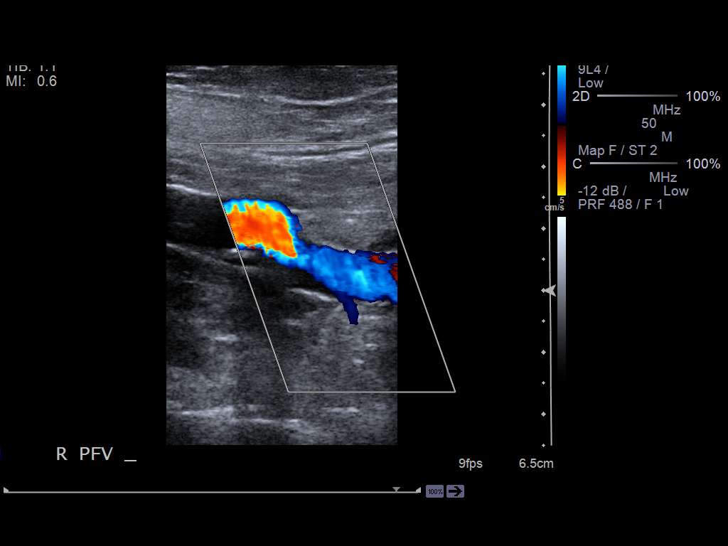
[im 15/24]
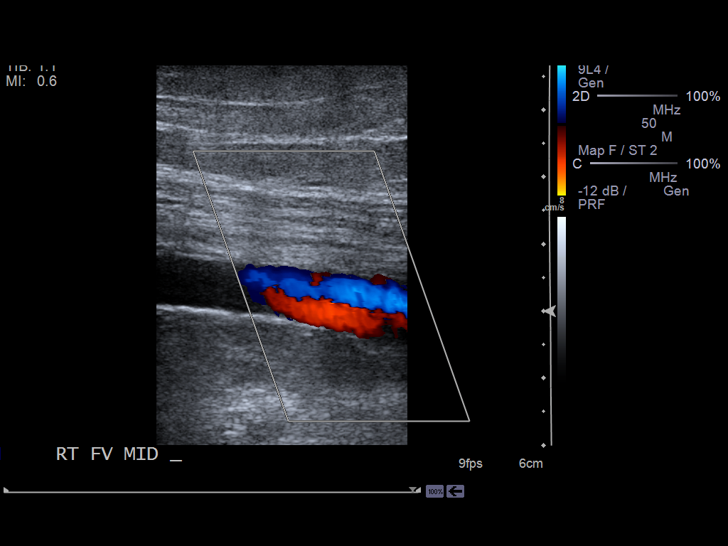
[im 17/24]
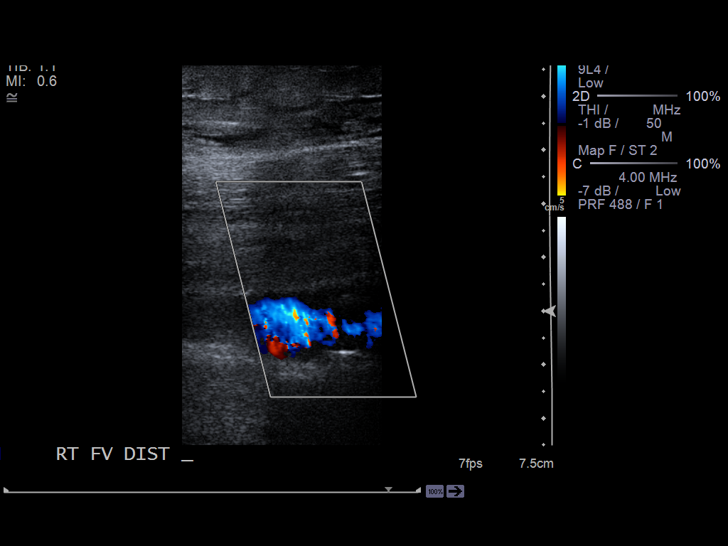
[im 19/24]
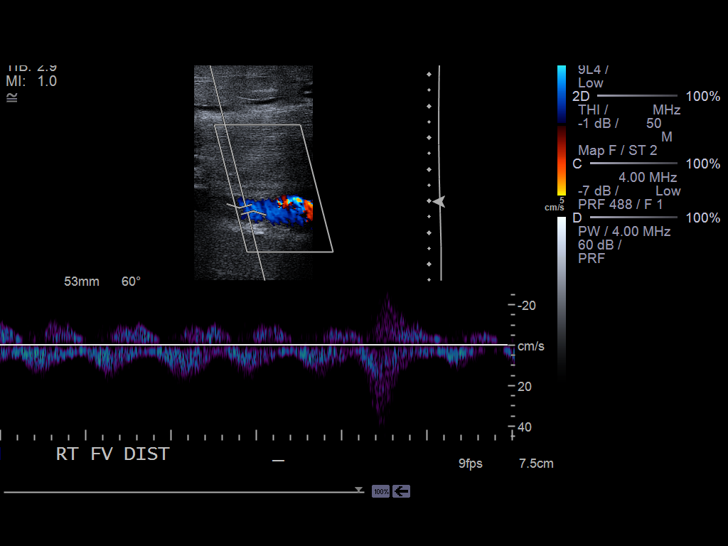
[im 20/24]
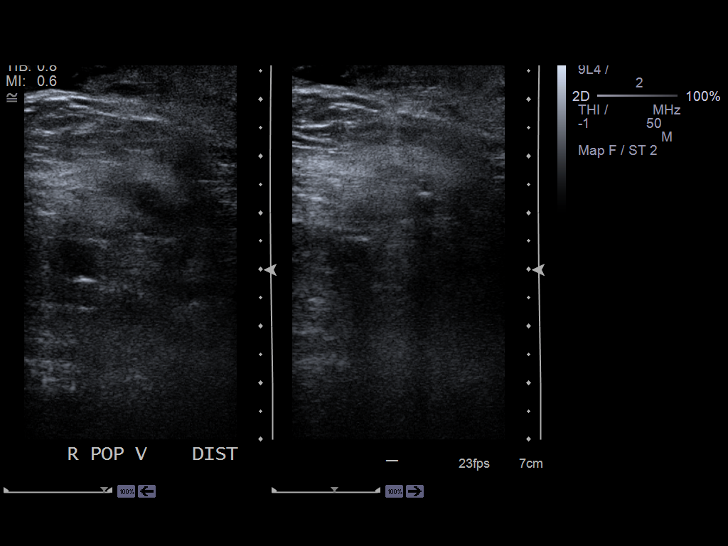
[im 22/24]
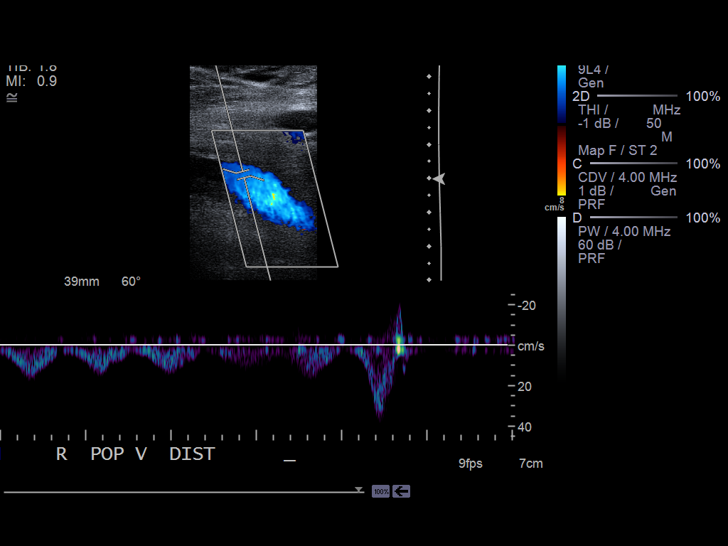
[im 24/24]
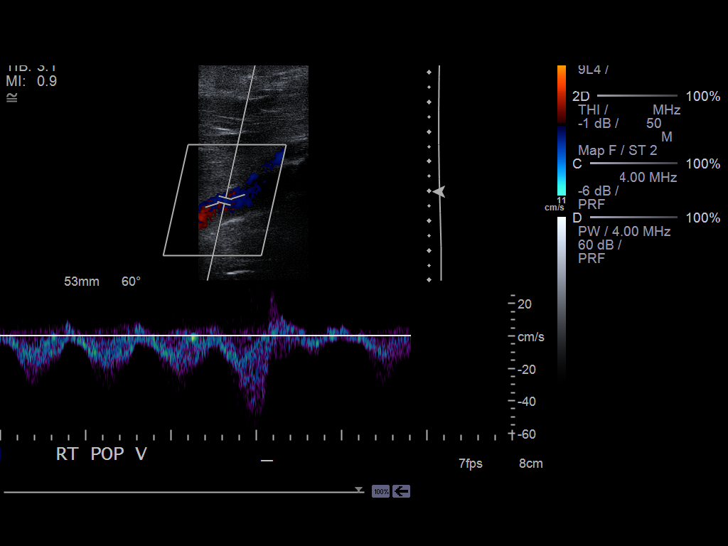

[14 of 24 positions shown; findings below may reference images not displayed]

FINDINGS: Multiple longitudinal and transverse gray-scale as well as color
and spectral Doppler images of the right lower extremity veins were obtained
from the common femoral veins through the popliteal veins.

The right common femoral, greater saphenous, femoral, popliteal veins, and
venous trifurcation are patent, demonstrating normal color-flow and
compressibility. No intraluminal thrombus is identified.There is normal
respiratory variation and augmentation demonstrated at all vein levels.
IMPRESSION: No evidence of DVT in the right lower extremity.

[REDACTED]

## 2014-11-19 NOTE — Consult Note (Signed)
PATIENT NAME:  [name], [name] MR#:  [id number] DATE OF BIRTH:  08-20-1945  DATE OF CONSULTATION:  04/15/2012  REFERRING PHYSICIAN:  Dr. Delfino LovettVipul Shah  CONSULTING PHYSICIAN:  Dorthia Tout B. Heath Tesler, MD  REASON FOR CONSULTATION: Altered mental status.   HISTORY OF PRESENT ILLNESS: [name] is a chronically ill 69 year old male with numerous medical problems that include diabetes, anemia, endstage renal disease on hemodialysis, neuropathy, retinopathy, coronary artery disease, hyperlipidemia, stroke, hypothyroidism, recurrent C. difficile, and chronic lymphedema who was admitted on 09/13 for altered mental status while he was at dialysis. Apparently he was seen in the Emergency Department on 09/11 for right leg laceration that was stapled and he was discharged home on Keflex. The patient came back to the Emergency Department on 09/13 with complaints of neck pain and feeling achy. There was a concern for lower extremity cellulitis. The patient did not have a fever or leukocytosis at the time. He was discharged home.  He then went to dialysis that day and began getting very agitated and combative there. Due to the altered mental status, EMS was called. He was found to have a blood sugar of 30. He was given D50. He was then admitted to the floor. His mental status has subsequently improved. His blood sugar this morning is normal. He is currently on dialysis. He does not remember any events of yesterday. He is not very interactive or conversant because he is very upset about being in the hospital. There are no reports of any seizure-like activity while at dialysis. He does not have a history of seizures. There have been no reports of any focal neurologic deficits.   REVIEW OF SYSTEMS: Complete 14-point review of systems is negative other than what is mentioned in the history of present illness.   HOME MEDICATIONS:  1. Acetaminophen/hydrocodone p.r.n. 2. Amlodipine.  3. Aspirin.  4. Keflex.  5. Finasteride.   6. Lasix.  7. Gabapentin.  8. Lansoprazole. 9. Insulin. 10. Lorazepam p.r.n.  11. Reglan p.r.n. 12. MiraLAX. 13. NovoLog.  14. Zocor.  15. Synthroid. 16. Tamsulosin. 17. Vicodin. 18. Coumadin.  19. Metoprolol.  20. Folic acid.  21. B12.  22. Lipitor.  SOCIAL HISTORY: He lives at home by himself. He does have 24/7 caregivers. He is bedbound and uses a wheelchair. He is unable to move his legs. He says that this is from neuropathy.  He is a former smoker and quit in 2002. He denies alcohol use.   FAMILY HISTORY: Noncontributory.   PAST MEDICAL HISTORY:  1. Endstage renal disease on dialysis. 2. Recurrent C. difficile.  3. Insulin-dependent diabetes.  4. Peripheral neuropathy.  5. Diabetic retinopathy.  6. Anemia of chronic disease.  7. Coronary artery disease status post CABG.  8. Hyperlipidemia.  9. Chronic lymphedema.  10. History of stroke   PHYSICAL EXAMINATION:  VITAL SIGNS: Temperature 97, pulse 75, respirations 18, blood pressure 125/70.   GENERAL: Supine, receiving dialysis, no apparent distress, chronically ill-appearing.   CARDIOVASCULAR: Regular rate and rhythm.  RESPIRATORY: Clear anteriorly.   ABDOMEN: Soft.  SKIN:  He has chronic skin changes and edema of his bilateral lower extremities.   MENTAL STATUS:  He is alert and oriented times four. Naming and repetition are intact. Speech is fluent, no dysarthria. He follows commands.   CRANIAL NERVES: Pupils equal, round, and reactive to light. Full visual fields. Extraocular movements intact. No facial sensory deficits. Facial expression symmetric. shoulder shrug 5/5 bilaterally. Tongue midline.   MOTOR: Examination of the right upper extremity  is limited given that he is receiving dialysis. He has full grip strength of the right hand. His left upper extremity strength is five out of five without drift. He is not able to move his bilateral lower extremities.   DEEP TENDON REFLEXES: Diffuse areflexia. Toes  are mute.   SENSATION: Decreased light touch, temperature, and pinprick in a stocking-glove distribution.   COORDINATION: No ataxia with finger-to-nose on the left.   GAIT:  Not able to be assessed secondary to bilateral lower extremity weakness.   LABORATORY DATA: CBC significant for anemia with hematocrit of 21.4. Fingerstick glucose is 95. Cardiac enzymes are negative. Basic metabolic panel is significant for elevated BUN of 37, creatinine of 4.13.  LFTs are significant for elevated alkaline phosphatase of 163. INR is 1.6. Blood cultures are negative to date. TSH is 8.06.   IMAGING: Noncontrast head CT on 09/13 shows atrophy, small vessel ischemic disease, and no acute intracranial process.  CT of the neck shows degenerative changes of the cervical spine. No acute process is noted. There are emphysematous changes in the pulmonary apices. There is also a pleural effusion on the right, which may be new.    Right lower extremity Doppler shows no evidence of deep vein thrombosis in the right lower extremity.   ASSESSMENT AND PLAN: [name] is a 68 year old male with an episode of altered mental status at dialysis yesterday that appears to be secondary to hypoglycemia. His mental status subsequently improved after administration of D50. Differential diagnosis does include seizures. However, there is no report of any seizure-like activity, such as twitching, unresponsiveness, incontinence, automatism, et Karie Soda. There is no evidence of stroke on head CT or exam.   RECOMMENDATIONS: .  1. No further neurological work-up is needed since patient is back to his baseline mental status. 2. EEG is warranted if additional episodes occur in the setting of normoglycemia.  3. Would check B12 and folate levels.  4. Address elevated TSH.  5. Okay for discharge from a neurologic standpoint.   Thank you for this consultation.   ____________________________ Sid Falcon Cadell Gabrielson, MD cbs:bjt D: 04/15/2012  11:40:54 ET T: 04/15/2012 12:24:27 ET JOB#: [id number]  cc: Ardith Test B. Alger Simons, MD, <Dictator> Trellis Paganini MD ELECTRONICALLY SIGNED 04/16/2012 13:31

## 2014-11-19 NOTE — H&P (Signed)
PATIENT NAME:  [name], [name] MR#:  [id number] DATE OF BIRTH:  03-Sep-1945  DATE OF ADMISSION:  06/20/2012  PRIMARY CARE PHYSICIAN: Tillman Abide, MD  HISTORY OF PRESENT ILLNESS: The patient is a 69 year old Caucasian male with past medical history significant for end-stage renal disease who was admitted and discharged on 06/06/2012 for increasing weakness, inability to transfer at home. He was discharged with home health to home. Now he comes back with being unresponsive. Apparently he was seen in hemodialysis over the past one week where he had only 1-1/2 hemodialysis hours. Yesterday he was in hemodialysis session, hemodialysis was just initiated for approximately 30 minutes. He had diarrheal stool and he was sent back home. He presented now unresponsive, Accu-Chek which was done in the emergency room showed glucose level as low as 26. Now he is getting D5 water at 250 mL an hour and his glucose level remains low at 65. He is not responding and not able to provide any review of systems at this point. Hospitalist services were contacted for admission because of unresponsive status, altered mental status, as well as hypoglycemia. The patient was also noted to be significantly swollen.   PAST MEDICAL HISTORY:  1. History of admission at beginning of November 2013 for generalized weakness which was felt to be nonspecific. 2. History of end-stage renal disease. 3. 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 4. Cerebrovascular accident. 5. History of coronary artery disease, status post coronary artery bypass grafting.  6. Chronic lower extremity swelling. 7. Hypothyroidism. 8. Dyslipidemia. 9. Benign prostatic hypertrophy. 10. Secondary hyperparathyroidism. 11. Recurrent Clostridium difficile colitis. 12. Anemia of chronic disease. 13. Chronic lower extremity edema with Unna boots. 14. History of right upper extremity fistula formation.    MEDICATIONS: The patient was  discharged on 06/06/2012 on the following medications. Unfortunately, it is unclear if any medications where changed at all or not. 1. Aspirin 325 mg p.o. daily. 2. Finasteride 5 mg p.o. daily.  3. Tamsulosin 0.4 mg p.o. daily.  4. Lasix 20 mg p.o. daily. 5. Acetaminophen/hydrocodone 325/5 mg one to two tablets every six hours as needed.  6. Amlodipine 5 mg p.o. daily. 7. Lansoprazole 30 mg p.o. daily.  8. Simvastatin 80 mg p.o. at bedtime. 9. Reglan 5 mg p.o. four times daily as needed. 10. Levothyroxine 137 mcg p.o. daily.  11. Acetaminophen 650 mg p.o. every four hours as needed. 12. Lorazepam 0.5 mg p.o. daily as needed.  13. Gabapentin 600 mg p.o. at bedtime. 14. NovoLog insulin three times daily as needed.  15. Insulin Lantus 50 units subcutaneously at bedtime.  DRUG ALLERGIES: Ciprofloxacin.   SOCIAL HISTORY: The patient lives at home by himself. He has a caregiver 24/7 at home. He is bedbound. He uses a wheelchair. He quit smoking in 2002. No alcohol or drug abuse, according to medical records. The patient has two sons who live nearby.   FAMILY HISTORY: The patient's mother is healthy, living at 69. The patient's father died at age of 28 from myocardial infarction.   REVIEW OF SYSTEMS: Not obtainable as the patient is unresponsive.   PHYSICAL EXAMINATION:  VITALS: On arrival to the Emergency Room, the patient's temperature is 96.5, pulse 65, respiration rate 18, blood pressure 108/63, and saturation 92% on room air.   GENERAL: This is a well-developed, well-nourished Caucasian male in no significant distress lying on the stretcher and snoring.   HEENT: His pupils are equal and reactive to light. Extraocular movements intact. No icterus  or conjunctivitis. Not able to assess his hearing due to the patient being unresponsive. He is not responding to sternal stimuli unless actively stimulated for prolonged period of time then he is able to move around and he is able to move upper  extremities as well as lower extremities. He tries to Research scientist (medical).   NECK: No masses, supple and nontender. Thyroid is not enlarged. No adenopathy and no JVD or carotid bruits bilaterally. Full range of motion.  PULMONARY: Lungs are clear to auscultation. Few rhonchi were heard. The patient snores whenever he is left alone and he does have some rhonchorous labored inspirations, but not in overt respiratory distress.   CARDIOVASCULAR: S1 and S2 appreciated. No murmurs, gallops, or rubs noted.  PMI is not lateralized. Chest is nontender to palpation.   EXTREMITIES: 1+ pedal pulses. The patient does have lower extremity edema, 2 to 3+ lower extremity edema bilaterally. No calf tenderness or cyanosis was noted. The patient has Unna boots placed with some erosions and scrapings of his knee of the right side.   ABDOMEN: Soft and nontender. Bowel sounds are present. No hepatosplenomegaly or masses were noted.   RECTAL: Deferred.  FLANKS: Significant swelling and dependent areas.   MUSCLE STRENGTH: Not able to assess as the patient is not able to actively engage into any kind of physical activity and, as mentioned above, he is able to move his upper and lower extremities whenever he is stimulated with pain, sternal rub. Gait was not tested.  SKIN: Multiple erosion, lesions on his right knee, as mentioned above. Also some induration and erosions just above his Unna boots likely due to [employer] themselves, especially on the right side. Skin otherwise was warm and dry to palpation, somewhat hot to palpation, as well as swelling in the left lower extremity just above Unna boot.  LYMPH: No adenopathy in the cervical region.   NEUROLOGIC: Not able to assess the patient's cranial nerves or sensory. He is somewhat dysarthric whenever he is a little bit more awake. He is not alert. He is not oriented. He is not cooperative. He is very somnolent and barely awaken.   RESULTS: His EKG revealed sinus rhythm at  63 beats per minute, normal axis, sinus arrhythmia, left bundle branch block, and nonspecific ST-T changes. No significant change since prior EKG.  Chest x-ray done today, on 06/20/2012, revealed findings suggesting interstitial pulmonary edema, mild right basilar opacities which may represent a combination of atelectasis and small pleural effusion, infection is not excluded. Follow-up PA and lateral chest radiographs were recommended by the radiologist.   Glucose level 26, BUN and creatinine 55 and 5.24, sodium 132, and potassium 4.3, otherwise BMP was unremarkable. Bicarbonate level is 22. Liver enzymes revealed albumin level of 3.2 and alkaline phosphatase 152, otherwise liver enzymes were unremarkable. Troponin is elevated at 0.07. MB as well as CK fractions are not checked. The patient's white blood cell count is normal at 6.2, hemoglobin 11.6, and platelet count 169.   Urinalysis: Amber/turbid urine, negative for glucose, bilirubin, or ketones, specific gravity 1.018, pH 5.0, 2+ blood, 100 mg/dL protein, negative for nitrites, 2+ leukocyte esterase, 53 red blood cells, 400 white blood cells, trace bacteria, and 3 epithelial cells were noted. Also white blood cell count clumps, mucus as well as budding yeast and hyaline casts.   ASSESSMENT AND PLAN:  1. Metabolic encephalopathy, likely due to hypoglycemia. Admit the patient to the medical floor, start him on D5 water and follow his neurologic status  and frequent neuro checks.  2. Hypoglycemia. We will get Accu-Cheks. We will continue D50 and water.  3. Questionable bacterial pneumonia. We will initiate the patient on vancomycin and Zosyn as well as Zithromax due to the patient's recent admission in the hospital and concerns of health-care-associated pneumonia.  4. Pyuria, questionable urinary tract infection. Get urine cultures and continue antibiotic therapy.  5. End-stage renal disease. We will get nephrologist involved for hemodialysis.           6. Diarrhea. Get C. difficile as well as comprehensive stool cultures.   TIME SPENT: One hour and 15 minutes. ____________________________ Katharina Caperima Francis Doenges, MD rv:slb D: 06/20/2012 10:16:17 ET T: 06/20/2012 11:05:31 ET JOB#: [id number]  cc: Katharina Caperima Shaylene Paganelli, MD, <Dictator> Karie Schwalbeichard I. Letvak, MD Ferman Basilio MD ELECTRONICALLY SIGNED 06/30/2012 13:14

## 2014-11-19 NOTE — H&P (Signed)
PATIENT NAME:  [name], [name] DATE OF BIRTH:  22-Dec-1945  DATE OF ADMISSION:  04/14/2012  PRIMARY CARE PHYSICIAN: Tillman Abideichard Letvak, [name]   NEPHROLOGIST: Dr. Thedore MinsSingh    REQUESTING PHYSICIAN: Dorothea GlassmanPaul Malinda, [name]   CHIEF COMPLAINT: Altered mental status.   HISTORY OF PRESENT ILLNESS: The patient is a 69 year old male with a known history of diabetes, anemia of chronic disease, and end-stage renal disease on hemodialysis who is being admitted for acute mental status changes. The patient was here on September 11th status post fall with right leg laceration which was stapled and the patient was discharged home on Keflex. The patient came back to the Emergency Department with left-sided neck pain. He was aching all over. Initially the ED physician felt that the patient had cellulitis on his right leg and admission was called, although on my evaluation when I came and saw the patient there were signs of cellulitis, the patient did not have any fever, leukocytosis, or hypotension to go with infection either and I recommended the ED physician to discharge him home after evaluation of his neck if findings were negative. He underwent CT scan of the head and neck which were negative and because he missed dialysis today he was sent out to hemodialysis where the patient started acting up. He was very combative and kept saying "leave me alone". They could not do dialysis and admission was requested for acute mental status changes. I did ask Psychiatry, Dr. Jennet MaduroPucilowska, to see him and she felt the patient had acute delirium likely due to medical condition. I came back to evaluate him again as he does seem quite altered. Fingerstick blood sugar was requested by me which shows blood sugar of 30 which is likely the contributor for his altered mental status. He has been given D50 and he is being admitted for further evaluation and management as they cannot do dialysis at this point in time and Dr. Thedore MinsSingh requested  overnight observation with likely dialysis in the morning and possible discharge if his mental status is better. At this time the patient is very lethargic and unable to answer any questions.   REVIEW OF SYSTEMS: Unable to obtain due to altered mental status and lethargy.   ALLERGIES: Ciprofloxacin.   MEDICATIONS AT HOME:  1. Acetaminophen/hydrocodone 325/5 1 to 2 tablets p.o. every six hours as needed.  2. Amlodipine 5 mg p.o. daily.  3. Aspirin 325 mg p.o. daily.  4. Cephalexin 250 mg p.o. b.i.d.  5. Finasteride 5 mg p.o. every evening.  6. Lasix 20 mg p.o. daily. 7. Gabapentin 600 mg p.o. daily.  8. Lansoprazole 30 mg p.o. at bedtime. 9. Insulin Lantus 40 units sub-Q at bedtime.  10. Lorazepam 0.5 mg p.o. three times a day as needed.  11. Metoclopramide 5 mg p.o. 4 times a day as needed.  12. MiraLAX twice a day.  13. NovoLog subcutaneous per sliding scale.  14. Zocor 80 mg p.o. at bedtime.  15. Synthroid 50 mcg p.o. daily.  16. Tamsulosin 0.4 mg p.o. every evening. 17. Vicodin four times a day. 18. Warfarin 4 mg p.o. at bedtime.  19. Metoprolol 25 mg p.o. daily.  20. Folic acid 1 mg p.o. daily. 21. Cyanocobalamin 100 mcg p.o. daily.  22. Atorvastatin 80 mg p.o. daily. 23. Aspirin 81 mg p.o. daily.   SOCIAL HISTORY: Per previous records he lives at home by himself. He is bedbound and gets around using a wheelchair. He does have 24/7 caregivers. He is a  former smoker, quit in 2002. No alcohol use.   FAMILY HISTORY: Per records mom healthy and living at age of 11. Dad died in 31's from heart disease.   PAST SURGICAL HISTORY:  1. Cataract. 2. Coronary artery bypass graft. 3. AV fistula in his right arm.   PAST MEDICAL HISTORY:  1. End-stage renal disease, on hemodialysis Monday, Wednesday, Friday.  2. Recurrent Clostridium difficile.  3. Insulin-dependent diabetes mellitus.  4. Peripheral neuropathy.  5. Diabetic retinopathy.  6. Anemia of chronic disease. 7. Coronary  artery disease, status post coronary artery bypass graft.  8. Hyperlipidemia.  9. Chronic lymphedema. 10. History of CVA with residual right-sided weakness.   PHYSICAL EXAMINATION:   VITAL SIGNS: Temperature 97.8, heart rate 80 per minute, respirations 18 per minute, blood pressure 149/87 mmHg. He is saturating 94% on room air.   GENERAL: The patient is a 69 year old male barely arousable as he just received 5 mg of Haldol. Unable to evaluate him much.   HEENT: Head atraumatic, normocephalic. Oropharynx and nasopharynx clear.   NECK: Supple. No jugular venous distention. No thyroid enlargement or tenderness.   LUNGS: Clear to auscultation bilaterally. No wheezing, rales, rhonchi or crepitation.   CARDIOVASCULAR: S1, S2 normal. No murmurs, rubs, or gallops.   ABDOMEN: Soft, nontender, nondistended. Bowel sounds present. No organomegaly or mass.   EXTREMITIES: He has chronic lymphedema and venostasis changes. He does have a laceration on his right shin in the middle area and those are stable. No signs of cellulitis. No discharge. His left leg is wrapped in Unna boot below the knee.    NEUROLOGIC: Unable to evaluate but it seems nonfocal. He is moving his extremities on his own (voluntarily). At this point I cannot evaluate him completely as he has been given 5 mg IV Haldol as he was quite agitated earlier. Earlier when I evaluated him in the Emergency Department he was moving all his extremities. Strength and sensation was intact.   PSYCHIATRIC: Unable to evaluate as he is barely arousable.  LABORATORY, DIAGNOSTIC, AND RADIOLOGICAL DATA: Sodium 138, potassium 4.7, chloride 103, CO2 26, BUN 35, creatinine 3.94, blood glucose 103, repeat check 30. BNP 26,513. Normal liver function tests except alkaline phosphatase of 177. Normal two sets of troponin. TSH 8.06. CBC within normal limits except hemoglobin of 9.6, hematocrit 29.6.   CT scan of the head without contrast showed no acute  intracranial abnormality.   CT scan of the neck with contrast showed no acute changes. Degenerative changes present in the cervical spine. Emphysematous changes in the pulmonary apices with pleural effusion on the right.   Chest x-ray showed trace right-sided pleural effusion along with bilateral diffuse interstitial thickening suggestive of interstitial edema. No focal consolidation or pneumothorax.   Right lower extremity Doppler showed no evidence of DVT.   EKG shows normal sinus rhythm, left bundle branch block which seems to be chronic.   IMPRESSION AND PLAN:  1. Acute mental status changes, likely metabolic encephalopathy from hypoglycemia. His blood sugar is 30, has been ordered D50, likely from getting insulin at night and he's been waiting in the Emergency Room since then. He probably has not had anything to eat. Initially per Neurology conversation he was requested to get lumbar puncture, although will hold off as there has not been coags ordered either as the patient is on Coumadin so will check coags. Consult Neurology. Check urinalysis. I do not see any signs or symptoms of infection. Will order Haldol for now as needed and  consult Psychiatry.  2. Hypothyroidism. His TSH is 8.6. Will increase the dose of Synthroid.  3. End-stage renal disease, on hemodialysis. Will consult Nephrology. Discussed with Dr. Thedore Mins. He will dialyze him in the morning.  4. Coronary disease with negative troponins. Will monitor him on off-unit telemetry.  CODE STATUS: FULL CODE.         TOTAL TIME TAKING CARE OF THIS PATIENT: 55 minutes.   ____________________________ [name]. Sherryll Burger, [name] vss:drc D: 04/14/2012 16:58:06 ET T: 04/14/2012 17:29:50 ET JOB#: [id number]  cc: Lempi Edwin S. Sherryll Burger, [name], <Dictator> Karie Schwalbe, [name] Mosetta Pigeon, [name] [name] ELECTRONICALLY SIGNED 04/14/2012 18:39

## 2014-11-19 NOTE — Consult Note (Signed)
Brief Consult Note: Diagnosis: Altered mental status.   Patient was seen by consultant.   Recommend further assessment or treatment.   Discussed with Attending MD.   Comments: [name] has no psychiatric past. He came for his dialysis and was found to be uncooperative, nort at his baseline.   The patient refuses psychiatric interview.   PLAN: 1. The patient presents with acute metal status change that is likely due to medical condition. No psychiatric admission indicated..  Electronic Signatures: Kristine LineaPucilowska, Kriste Broman (MD)  (Signed 13-Sep-13 15:26)  Authored: Brief Consult Note   Last Updated: 13-Sep-13 15:26 by Kristine LineaPucilowska, Marshawn Normoyle (MD)

## 2014-11-19 NOTE — Consult Note (Signed)
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  Will follow in office and if enlarges may consider intervention in the future.IJ "non-occlusive thrombus" is likely old thrombus from previous catheters.  No intervention required and would not up his anticoagulation based on this finding.  plan to see in office in a month or two in follow up.  No other in hospital recs.  Electronic Signatures: Annice Needyew, Jason S (MD)  (Signed on 25-Nov-13 14:48)  Authored  Last Updated: 25-Nov-13 14:48 by Annice Needyew, Jason S (MD)

## 2014-11-19 NOTE — Discharge Summary (Signed)
PATIENT NAME:  [name], [name] MR#:  [id number] DATE OF BIRTH:  [birth year]  DATE OF ADMISSION:  05/02/2012 DATE OF DISCHARGE:  05/03/2012  PRIMARY CARE PHYSICIAN:  Dr. Alphonsus SiasLetvak  The patient signed out AGAINST MEDICAL ADVICE on 05/03/2012.   DISCHARGE DIAGNOSES: 1. Severe hyperglycemia and diarrhea.  2. Endstage renal disease.  3. Hypothyroidism.  4. Hypertension.  5. Coronary artery disease.  6. Urinary tract infection.   REASON FOR ADMISSION: Generalized weakness, diarrhea.   HOSPITAL COURSE: The patient is a 69 year old gentleman with endstage renal disease,  diabetes, and chronic lymphedema who presented to the ED with generalized weakness and diarrhea. The patient was noted to have a high blood sugar at 600.  He was admitted to the Critical Care Unit due to hyperglycemia and was treated with insulin drip.  The patient's blood sugar was controlled on an insulin drip, so insulin drip was stopped yesterday. The patient was transferred out from the Critical Care Unit. However, the patient signed out AGAINST MEDICAL ADVICE last night.   ____________________________ Shaune PollackQing Guinn Delarosa, MD qc:bjt D: 05/04/2012 13:11:06 ET T: 05/04/2012 15:17:29 ET JOB#: [id number]  cc: Shaune PollackQing Leeanne Butters, MD, <Dictator> Karie Schwalbeichard I. Letvak, MD Shaune PollackQING Kayleann Mccaffery MD ELECTRONICALLY SIGNED 05/05/2012 14:01

## 2014-11-19 NOTE — Consult Note (Signed)
Brief Consult Note: Diagnosis: complicatiion of dialysis device, end stage renal disease.   Patient was seen by consultant.   Recommend further assessment or treatment.   Discussed with Attending MD.   Comments: Patien has increasing edema of the right arm associated with increased venous presures  while on dialysis.  This implies central venous stenosis.  He should have a central venogram with the hope for intervention which can be done as an outpatient.  Electronic Signatures: Levora DredgeSchnier, Niels Cranshaw (MD)  (Signed 26-Jul-13 14:44)  Authored: Brief Consult Note   Last Updated: 26-Jul-13 14:44 by Levora DredgeSchnier, Maikol Grassia (MD)

## 2014-11-19 NOTE — Consult Note (Signed)
PATIENT NAME:  [name], [name] [id number] OF BIRTH:  October 26, 1945 OF ADMISSION:  11/19/2013OF CONSULTATION:  06/22/2012 PHYSICIAN:  Dr. Katharina Caperima Vaickute PHYSICIAN:  Kristine LineaJolanta Pucilowska, MD  REASON FOR CONSULTATION: To assess the capacity to make decisions about disposition.   DATA: [name] is a 69 year old male with no pat psychiatric history.  COMPLAINT: "I feel better today." OF PRESENT ILLNESS: [name] has a history of diabetes and end-stage renal disease on dialysis. He was brought to the hospital by his caregiver with altered mental status. He was initially refusing dialyses. I tried to interview him yesterday but he was extremely irritable and refused interview. He has been compliant with treatment in the hospital, including dialyses. He feels much better today. He denies symptoms of depression, anxiety or psychosis. He denies use of alcohol, drugs or prescription pill abuse.  PSYCHIATRIC HISTORY: Denies hospitalizations, pharmacotherapy or suicide attempts. He had been in therapy for a year and a half after discharge from the [employer].  PSYCHIATRIC HISTORY: Denies.  MEDICAL HISTORY:  1. Diabetes.  2. End-stage renal disease. Diabetic neuropathy.  Cerebrovascular accident. Coronary artery disease, status post coronary artery bypass grafting.  Dyslipidemia. . Hypothyroidism. Anemia of chronic disease. Benign prostatic hypertrophy. Secondary hyperparathyroidism. Recurrent Clostridium difficile colitis. ON ADMISSION:  1. Aspirin 325 mg p.o. daily. 2. Finasteride 5 mg p.o. daily.  3. Tamsulosin 0.4 mg p.o. daily.  Lasix 20 mg p.o. daily. Acetaminophen/hydrocodone 325/5 mg one to two tablets every six hours as needed.  Amlodipine 5 mg p.o. daily. Lansoprazole 30 mg p.o. daily.  Simvastatin 80 mg p.o. at bedtime. Reglan 5 mg p.o. four times daily as needed. Levothyroxine 137 mcg p.o. daily.  Acetaminophen 650 mg p.o. every four hours as needed. Lorazepam 0.5 mg p.o. daily as needed.  Gabapentin 600 mg p.o.  at bedtime. NovoLog insulin three times daily as needed.  Insulin Lantus 50 units subcutaneously at bedtime.  DRUG ALLERGIES: Ciprofloxacin.  HISTORY: The patient served in the [employer] during [place]. He has been a widower since 1999. He lives at home by himself and has a caregiver at home around the clock. He is bedbound and uses a wheelchair. The patient has two sons who live nearby.  OF SYSTEMS: CONSTITUTIONAL: Positive for fatigue and weight loss. EYES: No double or blurred vision. ENT: No hearing loss. RESPIRATORY: Positive for unproductive cough. CARDIOVASCULAR: No chest pain or orthopnea. GASTROINTESTINAL: No abdominal pain, nausea, vomiting, or diarrhea. GU: No incontinence or frequency. ENDOCRINE: No heat or cold intolerance. LYMPHATIC: No anemia or easy bruising. INTEGUMENTARY: Positive for multiple wounds of lower extremities. MUSCULOSKELETAL: Positive for lower extremities weakness and pain. NEUROLOGIC: Positive for diabetic neuropathy. PSYCHIATRIC: See history of present illness for details.  EXAMINATION: VITAL SIGNS: Blood pressure 106/65, pulse 77, respirations 18, and temperature 97.1. GENERAL: This is an elderly gentleman in no acute distress. The rest of the physical examination was deferred to his primary attending.  DATA: Chemistries: Blood glucose 293, BUN 53, creatinine 5.35, sodium 132, potassium  4.6. CBC WBC 4.0, Hg 9.8, Ht 30.3, platelets 128. Urinalysis is suggestive of urinary tract infection.  STATUS EXAMINATION: The patient is alert and oriented to person, place, time, and situation. He is pleasant, polite, and cooperative. He is engaging and funny. He is wearing a hospital gown. He maintains good eye contact. His speech is of normal rhythm, rate, and volume. Mood is "OK" with full affect. Thought processing is logical and goal oriented. Thought content - he denies suicidal or homicidal ideation. There are no  delusions, paranoia or hallucinations. His cognition is grossly  intact. His insight and judgment are fair.  RISK ASSESSMENT ON ADMISSION: This is a patient with no pat psychiatric history who was admitted with altered mental status due to medical problems. He is no longer delirious. He requires support and supervision.   I:  Cognitive disorder NOS.  Delirium secondary to medical condition resolved. out mood disorder secondary to medical condition.   AXIS II:  Deferred. III:  Multiple medical problems, see above.  IV:  Worsening of physical health, primary support.  V:  GAF 45.  The patient does not meet criteria for IVC. Please discharge as appropriate. No medications recommended.  The patient does have the capacity to make decisions about his disposition. He is able to discuss the issue with its pros and cons. Aparently, he was placed at nursing facilities three times already. He was "fired" all three times. He is disgusted with poor care patients received there and will do anything to postpone transfer to such facility although he recognizes that it may be necessary in the future. His caretakers and children reportedly support his decision.  He is willing to continue in dialyses. He has been doing it for 3 years already and feels that it is worth it in spite of all difficulties.  I will sign off. Please call if problems.   Electronic Signatures: Kristine Linea (MD)  (Signed on 22-Nov-13 01:06)  Authored  Last Updated: 22-Nov-13 01:06 by Kristine Linea (MD)

## 2014-11-19 NOTE — Discharge Summary (Signed)
PATIENT NAME:  [name], Deloyd E MR#:  [id number] DATE OF BIRTH:  [birth year]  DATE OF ADMISSION:  03/15/2012 DATE OF DISCHARGE:  03/17/2012  DISCHARGE DIAGNOSES:  1. Acute hypoxic respiratory failure due to fluid overload resulting from missing dialysis.  2. Diarrhea, none in the hospital. 3. End-stage renal disease, problems with dialysis access, status post fistulogram 03/16/2012. 4. Diabetes.  5. Diabetic neuropathy.  6. Anemia of chronic disease.  7. Chronic kidney disease.  8. Elevated troponins.  9. History of recurrent Clostridium difficile.  10. Peripheral neuropathy.  11. Retinopathy. 12. Coronary artery disease, status post coronary artery bypass graft. 13. Cerebrovascular accident with right hemiparesis.  14. Chronic lymphedema of bilateral lower extremity, status post Unna boots.   DISPOSITION: The patient is being discharged home. He did not wish any placement.   DIET: Low dose sodium, 1800 calorie ADA diet, low phosphorous diet.   ACTIVITY: As tolerated.   FOLLOWUP:  1. Follow up with Dr. Alphonsus SiasLetvak in one to two weeks after discharge. 2. Follow up with Dr. Thedore MinsSingh in one to two weeks after discharge.  3. Follow up with Dr. Mechele CollinElliott in one to two weeks after discharge.   DISCHARGE MEDICATIONS:  1. Lantus 40 units subcutaneously at bedtime.  2. Aspirin 325 mg daily.  3. Finasteride 5 mg daily.  4. Flomax 0.5 mg daily.  5. Lasix 20 mg daily.  6. Ativan 0.5 mg, 1 tablet t.i.d. as needed.  7. MiraLAX 1 capsule 1 to 2 times a day.  8. Neurontin 600 mg at bedtime.  9. Tylenol/hydrocodone 325/5, 1 to 2 tablets every 6 hours p.r.n.  10. Amlodipine 5 mg daily.  11. NovoLog sliding scale. 12. Prevacid 30 mg daily.  13. Simvastatin 80 mg daily.  14. Synthroid 50 mcg daily.  15. Reglan 5 mg q.i.d. before meals and at bedtime as needed.  16. Questran 4 to 5 grams t.i.d. with meals.   CONSULTATIONS:  1. Vascular Surgery consultation with Dr. Wyn Quakerew. 2. GI consultation with Dr.  Mechele CollinElliott. 3. Nephrology consultation with Dr. Thedore MinsSingh.   LABORATORY, DIAGNOSTIC, AND RADIOLOGICAL DATA: Chest x-ray showed cardiomegaly with interstitial infiltrates. Microbiology: Blood cultures negative. Initial blood cultures had shown coagulase negative staph in 1 out of 4 bottles. White count normal. Hemoglobin 10.6 to 9.8, platelet count 202. Creatinine 1.37, glucose 166. The rest of labs are essentially normal, mildly elevated troponin. TSH normal.   HOSPITAL COURSE: The patient is a 69 year old male with past medical history of end-stage renal disease with a history of missing dialysis, recurrent Clostridium difficile, status post prolonged treatment with Flagyl, vancomycin and Dificid, cerebrovascular accident with right hemiparesis, insulin-dependent diabetes, essentially wheelchair-bound, presented with shortness of breath. He was admitted with a diagnosis of acute hypoxic respiratory failure due to fluid overload. The patient had missed two dialysis treatments. He was admitted to the hospital and dialyzed, following which he felt better. He also complained of ongoing diarrhea. He was recently treated for prolonged Clostridium difficile infection with Flagyl, vancomycin and Dificid. GI consultation was obtained, who felt that some of the symptoms could be related to uremia due to missing dialysis and diabetic enteropathy. During the hospitalization, the patient did not have any further episodes of diarrhea. He had one formed stool. The patient has anemia of chronic disease which remained stable. His hemoglobin A1c is 8.6. He will continue his home medications, Lantus insulin sliding scale. There were problems with his hemodialysis access, therefore, he underwent a fistulogram on 03/16/2012. He had minimally elevated troponin  which was felt to be due to demand ischemia from fluid overload/respiratory distress versus poor renal clearance. On 03/15/2012, 1 out of 3  bottles of blood cultures grew  coagulase-negative staph. Repeat blood cultures have been negative so far. Placement was offered to the patient, however, he reported that he has 24-hour care at home and wished to go home. He is being discharged home in stable condition.   TIME SPENT: 45 minutes.   ____________________________ Darrick Meigs, MD sp:cbb D: 03/17/2012 15:10:27 ET T: 03/18/2012 12:49:24 ET JOB#: [id number]  cc: Darrick Meigs, MD, <Dictator> Karie Schwalbe, MD Darrick Meigs MD ELECTRONICALLY SIGNED 03/21/2012 14:01

## 2014-11-19 NOTE — Consult Note (Signed)
Brief Consult Note: Diagnosis: Cognitive disorder NOS, Altered mental status.   Patient was seen by consultant.   Consult note dictated.   Recommend further assessment or treatment.   Comments: [name] has no psychiatric history. He initially refused HD and I was asked to asses the capacity to make decisions about HD. He has ben accepting dialyses now and feels much bettre. He wants to return to home where he lives independently with sevaral caregivers. His caregivers reportedly are making changes at the house so the patient can return there which is his wish. His sons are on board. Even though the patient is DNR he did not have a conversation with his family about nursing home option. He reports that he was "fired" from 3 nursing homes in the past. He was also responsible for having workers fired from nursing homes from not doing their job.   PLAN: 1. The patient does have the capacity to make decisions about disposition.  2. No medications recommended.  3. I will follow up.  Electronic Signatures: Kristine LineaPucilowska, Alliya Marcon (MD)  (Signed 21-Nov-13 13:56)  Authored: Brief Consult Note   Last Updated: 21-Nov-13 13:56 by Kristine LineaPucilowska, Elbony Mcclimans (MD)

## 2014-11-19 NOTE — Discharge Summary (Signed)
PATIENT NAME:  [name], [name] MR#:  [id number] DATE OF BIRTH:  Jun 24, 1946  DATE OF ADMISSION:  06/03/2012 DATE OF DISCHARGE:  06/06/2012  DISCHARGE DIAGNOSIS: Progressive increasing weakness with inability to transfer at home, requiring Hoyer bed and [employer] lift.   SECONDARY DIAGNOSES:  1. Endstage renal disease on hemodialysis. 2. 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  3. Cerebrovascular accident.  4. Coronary artery disease, status post coronary artery bypass graft.  5. Chronic leg edema.  6. Hypothyroidism.  7. Dyslipidemia.  8. Benign prostatic hypertrophy.  9. Secondary hyperparathyroidism.  10. Recurrent Clostridium difficile colitis.  11. Anemia of chronic disease.   CONSULTANTS: Nephrology, Dr. Thedore MinsSingh.   PROCEDURES/RADIOLOGY: Inpatient hemodialysis during his stay in the hospital.   MAJOR LABORATORY PANEL: Intact PTH on 11/02 was 95.   HISTORY AND SHORT HOSPITAL COURSE: The patient is a 69 year old male with above-mentioned medical problems who was admitted for progressively increasing weakness with inability to transfer, with inability to get hemodialysis due to his worsening weakness. Please see Dr. Jearl KlinefelterSona Patel's dictated History and Physical for further details. The patient was admitted and was dialyzed while in the hospital by Dr. Thedore MinsSingh. After discussion with care management, physical therapy, and nephrology, the patient was provided a Hoyer bed to be placed underneath him at home and also a [employer] lift.  The patient was slowly improving and had several dialysis sessions while in the hospital and was discharged home in stable condition on 06/06/212.  On the date of discharge his vital signs were as follows: Temperature 97.6, heart rate 79 per minute, respirations 18 per minute, blood pressure 143/74 mmHg. He was saturating 91% on room air.   PERTINENT PHYSICAL EXAMINATION ON THE DATE OF DISCHARGE: CARDIOVASCULAR: S1, S2 normal. No murmurs, rubs, or gallop. LUNGS: Clear  to auscultation bilaterally. No wheezes, rales, rhonchi, or crepitation. ABDOMEN: Soft, benign. NEUROLOGIC: Nonfocal examination. All other physical examination remained at baseline.   DISCHARGE MEDICATIONS:  1. Aspirin 325 mg p.o. daily.  2. Finasteride 5 mg p.o. daily.  3. Tamsulosin 0.4 mg p.o. daily.  4. Lasix 20 mg p.o. daily.  5. Acetaminophen/hydrocodone 325/5 mg, one to two tablets p.o. every six hours as needed.  6. Amlodipine 5 mg p.o. daily.  7. Lansoprazole 30 mg p.o. daily.  8. Simvastatin 80 mg p.o. at bedtime.  9. Metoclopramide 5 mg p.o. four times a day as needed.  10. Levothyroxine 137 mcg p.o. daily.  11. Acetaminophen 650 mg p.o. every four hours as needed.  12. Lorazepam 0.5 mg p.o. daily as needed.  13. Gabapentin 600 mg p.o. at bedtime.  14. NovoLog insulin 3 times a day as needed.  15. Insulin Lantus 15 units subcutaneous at bedtime.   DISCHARGE DIET: Renal diet.   DISCHARGE ACTIVITY: As tolerated.   DISCHARGE INSTRUCTIONS AND FOLLOWUP:  1. The patient was instructed to follow up with his primary care physician, Dr. Tillman Abideichard Letvak, in 1 to 2 weeks.   2. He will get hemodialysis as scheduled at [facility] on Monday, Wednesday, and Friday.  3. Home Health services were resumed.  4. He was also set up to get a Hoyer bed and lift.    TOTAL TIME DISCHARGING THIS PATIENT: 55 minutes.    ____________________________ Ellamae SiaVipul S. Sherryll BurgerShah, MD vss:bjt D: 06/06/2012 23:25:11 ET T: 06/07/2012 11:27:34 ET JOB#: [id number]  cc: Raj Landress S. Sherryll BurgerShah, MD, <Dictator> Karie Schwalbeichard I. Letvak, MD Mosetta PigeonHarmeet Singh, MD Ellamae SiaVIPUL S [facility] MD ELECTRONICALLY SIGNED 06/07/2012 17:42

## 2014-11-19 NOTE — Consult Note (Signed)
PATIENT NAME:  [name], Miguel E MR#:  Mitchell DATE OF BIRTH:  12-10-1945  DATE OF CONSULTATION:  06/21/2012  REFERRING PHYSICIAN:  Dr. Katharina Caperima Vaickute  CONSULTING PHYSICIAN:  Sheppard Plumberimothy E. Hattye Siegfried, MD  REASON FOR CONSULTATION: Stage II pressure ulcer left buttock and open wound left ankle.   HISTORY: I have personally seen and examined [name]. I have reviewed his electronic medical record as well as his last wound care records from 2009.   [name] is a 69 year old male with a past medical history significant for end-stage renal disease and diabetes who apparently lives at home. He was recently admitted to the hospital here for declining functional status and has been managed as an outpatient. He apparently had episodes of hypotension and was admitted to the hospitalist service yesterday when he was found unresponsive with hypoglycemia. Since he has been admitted his mental status is improved, however, today I am unable to obtain accurate medical history as the patient states that he is too confused to provide much information. According to the chart, the patient has a history of end-stage renal disease, diabetes with retinopathy and neuropathy. He apparently is basically bedridden although he can get into a chair with assistance. He does not walk or stand.   According to the records the patient had Unna boots on when he came in. He is unclear who places those although at one point he thought that a home health nurse may do this once a week. In any event, review of his lower extremities reveal multiple excoriations from the knees down. He has significant hypertrichosis of his feet. We there is evidence of chronic lymphedema. There are no palpable pulses in his feet. There is no ischemic areas on his toes. He does have an open wound on his left ankle anteriorly at the crease between his ankle and foot. This has had a moderate amount of slough present. There is no discharge from the wound. There is  no surrounding erythema. In addition, on his left buttock there is a 1 cm stage II pressure ulcer. There are some Desitin applied to that area which I removed. I do not see any evidence of a deep tissue injury or anything more ominous.   ASSESSMENT AND PLAN: I think at the present time while he is in the hospital I would leave the Unna boots off. I would place Iodoflex or Iodosorb on the ankle and DuoDERM on his buttock. We would like to follow him back in the [facility] upon discharge so that we can re-establish care.   Please call the [facility] upon discharge to arrange for further follow up.   ____________________________ Sheppard Plumberimothy E. Thelma Bargeaks, MD teo:cms D: 06/21/2012 09:08:18 ET T: 06/21/2012 11:51:00 ET JOB#: [id number]  cc: Marcial Pacasimothy E. Thelma Bargeaks, MD, <Dictator>  Jasmine DecemberIMOTHY E Carrol Hougland MD ELECTRONICALLY SIGNED 07/02/2012 6:18

## 2014-11-19 NOTE — H&P (Signed)
PATIENT NAME:  [name], [name] MR#:  [id number] DATE OF BIRTH:  August 05, 1945  DATE OF ADMISSION:  03/15/2012  ADMITTING PHYSICIAN: Enid Baas, MD   PRIMARY MD: Dr. Alphonsus Sias   PRIMARY NEPHROLOGIST: Dr. Mady Haagensen   CHIEF COMPLAINT: Feeling generalized weak and also diarrhea.   HISTORY OF PRESENT ILLNESS: [name] is a 69 year old Caucasian male with past medical history significant for end-stage renal disease on Monday, Wednesday, Friday hemodialysis, diabetes, coronary artery disease, and history of Clostridium difficile who presents to the hospital secondary to the above-mentioned complaints. The patient has had multiple admissions in the past for missing hemodialysis and also history of Clostridium difficile in the past. The patient says that he had his last dialysis last week on Friday. He felt weak over the weekend and was sick on Monday and could not go to dialysis on Monday. He has also been having diarrhea, which is chronic for more than six months now, but does have acute bouts once in a while. He denies any fevers, chills, nausea, vomiting, or abdominal pain. He was having mild difficulty in breathing today and could not go to dialysis and was brought to the ER. He was hypoxic with sats 80% on room air when he initially presented and chest x-ray showed pulmonary edema. He is being admitted for his pulmonary edema and also diarrhea.   PAST MEDICAL HISTORY:  1. End-stage renal disease on Monday, Wednesday, Friday hemodialysis.  2. Past admission for recurrent Clostridium difficile.  3. Insulin dependent diabetes mellitus.  4. Peripheral neuropathy.  5. Diabetic retinopathy.  6. Anemia of chronic disease.  7. Coronary artery disease status post coronary artery bypass graft surgery.  8. Hyperlipidemia.  9. Chronic lymphedema of bilateral lower extremities.  10. History of CVA with residual right-sided weakness.   PAST SURGICAL HISTORY:  1. Cataract surgery.  2. Coronary  artery bypass graft surgery in 2002.  3. AV fistula on his right arm.   ALLERGIES TO MEDICATIONS: No known drug allergies.   CURRENT HOME MEDICATIONS:  1. Norco 325/5 mg 1 to 2 tablets q.6 hours p.r.n.  2. Norvasc 5 mg p.o. daily.  3. Aspirin 325 mg p.o. daily.  4. Finasteride 5 mg p.o. daily.  5. Furosemide 20 mg p.o. daily.  6. Gabapentin 600 mg p.o. daily.  7. Lansoprazole 30 mg p.o. daily.  8. Lantus 40 units sub-Q at bedtime.  9. Lorazepam 0.5 mg p.o. t.i.d. p.r.n.  10. Reglan 5 mg 4 times a day.  11. MiraLAX powder p.r.n. for constipation.  12. NovoLog sliding scale insulin.  13. Simvastatin 80 mg p.o. at bedtime.  14. Synthroid 50 mcg p.o. daily.  15. Flomax 0.4 mg p.o. daily.   SOCIAL HISTORY: The patient lives at home by himself. He is bedbound and gets around using a wheelchair. He has 24/7 caregivers. Two of them are caregivers and one is a home Geneticist, molecular. He used to smoke and quit in 2002. Prior to that he smoked about 4 packs per day for almost 35 years. Denies any alcohol use.   FAMILY HISTORY: Mom is 14, still healthy and living. Dad died in 61's from heart disease. His paternal side of the family had heart problems. Nobody in the family with any cancers, diabetes, or renal problems.   REVIEW OF SYSTEMS: CONSTITUTIONAL: Feels fatigued but no fever. EYES: History of cataract surgery. Uses glasses. No glaucoma or inflammation. ENT: No tinnitus, ear pain, hearing loss, epistaxis, or discharge. RESPIRATORY: No cough, wheeze, or hemoptysis.  CARDIOVASCULAR: No chest pain, orthopnea, edema, arrhythmia, palpitations, or syncope. GI: No nausea, vomiting, abdominal pain, or hematemesis. Positive for diarrhea. GU: Still makes urine even though he's a dialysis patient. Denies any dysuria, hematuria, or incontinence. ENDOCRINE: No polyuria, nocturia, thyroid problems, heat or cold intolerance. HEMATOLOGY: No anemia, easy bruising or bleeding. SKIN: No acne, rash, or lesions.  MUSCULOSKELETAL: Generalized pain, arthritis in shoulders and also neck. No gout. NEUROLOGIC: Weakness on the right side from a prior stroke. No history of seizures. PSYCH: No anxiety, insomnia, or depression.   PHYSICAL EXAMINATION:   VITAL SIGNS: Temperature 97.8 degrees Fahrenheit, pulse 97, respirations 19, blood pressure 149/68, pulse oximetry 80% on room air on admission.   GENERAL: Well built, well nourished man lying in bed not in any acute distress.   HEENT: Normocephalic, atraumatic. Pupils equal, round, reacting to light. Anicteric sclerae. Extraocular movements intact. Oropharynx clear without erythema, mass, or exudates.   NECK: Supple. No thyromegaly, JVD, or carotid bruits. No lymphadenopathy.   LUNGS: Moving air bilaterally. Has bibasilar crackles posteriorly. No wheeze. No use of accessory muscles for breathing.   CARDIOVASCULAR: S1, S2 regular rate and rhythm. III/VI murmur in the precordial area. No rubs or gallops.   ABDOMEN: Soft, nontender, nondistended. No hepatosplenomegaly. Normal bowel sounds.   EXTREMITIES: Has 3 to 4+ edema wrapped in Ace bandages from chronic lymphedema. Unable to palpate any dorsalis pedis pulses. Since lower extremities are covered, unable to find any ulcers or anything elsewhere on the body.   NEUROLOGIC: Strength is 4/5 on the right side and 5/5 on the left side. No cranial nerve deficits.   PSYCHOLOGICAL: The patient is awake, alert, and oriented x3.   LAB DATA: WBC 6.0, hemoglobin 10.6, hematocrit 33.3, platelet count 202, sodium 140, potassium 4.9, chloride 105, bicarb 19, BUN 45, creatinine 4.37, glucose 166, calcium 8.7, ALT 25, AST 35, alkaline phosphatase 165, total bilirubin 0.3, albumin 3.1. Troponin 0.24. TSH 4.04. ABG on admission showing pH of 7.31, pCO2 35, pO2 66, pulse oximetry 92% on room air.   Chest x-ray showing mild cardiomegaly with interstitial infiltrates, edematous versus nonedematous. Basilar atelectasis is present.    ASSESSMENT AND PLAN: This is a 69 year old male with history of hypertension, diabetes, end-stage renal disease on hemodialysis, and prior history of Clostridium difficile admitted for acute on chronic diarrhea and also missing hemodialysis now presenting with hypoxia.  1. Acute hypoxic respiratory failure, missed hemodialysis on Monday and also today. Chest x-ray representing pulmonary edema and hypoxic 80% sats on room air on admission. Continue oxygen support. Will give extra dose of Lasix in ED and Nephrology consult for hemodialysis today.  2. Diarrhea. Known history of chronic diarrhea and prolonged Clostridium difficile. He was treated with Flagyl, Vanc, and also Dificid for more than a year in the past. Since for the last six months his diarrhea has been chronic with acute bouts and now has acute bout again will check stool studies and also stool for Clostridium difficile. If positive will get GI consult.  3. End-stage renal disease on hemodialysis. He missed hemodialysis Monday and also today. Will get Nephrology consult for the same and also Vascular consult for swelling of right arm at fistula site. He was supposed to get an outpatient procedure for the same tomorrow anyway.  4. Diabetes mellitus. Continue Lantus and also sliding scale insulin.  5. Diabetic neuropathy. He is on gabapentin. The patient is bedbound and has caregivers at home so will be discharged home whenever he is ready.  6. Coronary artery disease status post bypass graft surgery, appears stable. Continue medications.  7. GI and DVT prophylaxis. On Protonix in the hospital and also sub-Q heparin.   CODE STATUS: FULL CODE.   TIME SPENT ON ADMISSION: 50 minutes.   ____________________________ Enid Baasadhika Hagen Tidd, MD rk:drc D: 03/15/2012 12:52:47 ET T: 03/15/2012 13:29:08 ET JOB#: [id number]  cc: Enid Baasadhika Alanny Rivers, MD, <Dictator>, Karie Schwalbeichard I. Letvak, MD, Mosetta PigeonHarmeet Singh, MD Enid BaasADHIKA Numair Masden MD ELECTRONICALLY SIGNED  03/15/2012 14:19

## 2014-11-19 NOTE — H&P (Signed)
PATIENT NAME:  [name], [name] MR#:  [id number] DATE OF BIRTH:  April 24, 1946  DATE OF ADMISSION:  06/03/2012  PRIMARY CARE PHYSICIAN: Tillman Abideichard Letvak, MD  CHIEF COMPLAINT: Progressive weakness, unable to get dialysis done since this past Monday.   HISTORY OF PRESENT ILLNESS: [name] is a 69 year old Caucasian gentleman well known to our service from previous many admissions who comes to the Emergency Room after he started having increasing progressive weakness at home, unable to get around, and was unable to be lifted from his commode this morning by the home helper. EMS brought the patient over here. The patient apparently has not been able to get his dialysis done since he was unable to get transferred from his wheelchair to the dialysis chair at the center. It was recommended to the patient that he obtain a Hoyer lift hence the patient was not dialyzed since last Monday. He is being admitted for further evaluation and management. The patient's potassium is 6.0. The patient is going to be getting dialysis on an urgent basis. He was seen by Dr. Cherylann RatelLateef in the Emergency Room.   PAST MEDICAL HISTORY:  1. End-stage renal disease, on hemodialysis.  2. 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  3. Cerebrovascular accident.  4. Coronary artery disease status post coronary artery bypass graft with cardiomyopathy and ejection fraction of 25%.  5. Chronic leg edema with Unna boots. 6. Hypothyroidism.  7. Dyslipidemia.  8. Benign prostatic hypertrophy.  9. Secondary hyperparathyroidism.  10. 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  11. Recurrent Clostridium difficile colitis.  12. Anemia of chronic disease.   PAST SURGICAL HISTORY:  1. Status post cataract surgery.  2. CABG.  3. Right arm AV fistula.   CURRENT MEDICATIONS:  1. Tamsulosin 0.4 mg p.o. daily.  2. Simvastatin 80 mg daily.  3. NovoLog subcutaneous three times daily per sliding scale.  4. Metoclopramide 5 mg  four times daily.  5. Lorazepam 0.5 mg p.o. daily as needed.  6. Levothyroxine 137 mcg p.o. daily.  7. Lantus 15 units daily.  8. Lansoprazole 30 mg p.o. daily.  9. Gabapentin 600 mg p.o. daily.  10. Lasix 20 mg daily.  11. Finasteride 5 mg daily.  12. Aspirin 325 mg p.o. daily.  13. Amlodipine 5 mg daily.  14. Tylenol/hydrocodone 5/325 mg one tablet every six hours as needed.   ALLERGIES: Cipro.   SOCIAL HISTORY: He lives at home by himself. The patient has a caregiver 24/7 at home. He is bedbound. He gets around using a wheelchair. He quit smoking in 2002. He denies any alcohol or drug use. He has two sons who live nearby.   FAMILY HISTORY: Mother is healthy and living at [id number]. Father died at age 71 from myocardial infarction.   REVIEW OF SYSTEMS: CONSTITUTIONAL: Positive for fatigue and weakness. EYES: No blurred or double vision. No glaucoma. ENT: No tinnitus, ear pain, or hearing loss. RESPIRATORY: No cough, wheeze, hemoptysis, or dyspnea. CARDIOVASCULAR: No chest pain, orthopnea, edema, or shortness of breath. GI: No nausea, vomiting, diarrhea, abdominal pain or gastroesophageal reflux disease. GU: No dysuria or hematuria. ENDOCRINE: No polyuria, nocturia, or thyroid problems. HEMATOLOGY: No anemia or easy bruising. SKIN: The patient has chronic leg swelling and edema of both lower extremities. MUSCULOSKELETAL: Positive for arthritis and weakness. NEUROLOGIC: No cerebrovascular accident or transient ischemic attack. Has generalized weakness. PSYCH: No anxiety or depression. All other systems are reviewed and negative.   PHYSICAL EXAMINATION:   GENERAL: The patient is  awake, alert, and oriented x3, not in acute distress.   VITAL SIGNS: Afebrile, pulse 70, blood pressure 150/60, and saturation 96% on 2 liters.   HEENT: Atraumatic, normocephalic. Pupils are equal, round, and reactive to light and accommodation. Extraocular movements intact. Oral mucosa is moist.   NECK: Supple. No JVD.  No carotid bruit.   LUNGS: Clear to auscultation bilaterally. No rales, rhonchi, respiratory distress, or labored breathing.   HEART: Both the heart sounds are normal. Rate and rhythm is regular. PMI is not lateralized. Chest is nontender.   EXTREMITIES: The patient has chronic leg edema.  Unable to feel pedal pulses. The patient has Unna boots on both lower extremities. Good femoral pulses bilaterally.   SKIN: Warm and dry.   NEUROLOGIC: Generalized weakness. The patient is able to move all the extremities. However, he has profound weakness, mainly in the lower extremities. No focal neuro deficit noted. No cranial nerve deficit noted.   SKIN: Warm and dry.   PSYCHIATRIC: The patient is awake, alert, and oriented x3.   LABS: Glucose 488, BUN 66, sodium 131, potassium 6.0, chloride 99, bicarbonate 14, calcium 8.9, phosphorus 5.9, and anion gap 18. White count 5.9, hemoglobin and hematocrit 10.4 and 33.1, platelet count 172, and MCV 101.   ASSESSMENT AND PLAN: 69 year old [name] with history of end-stage renal disease on hemodialysis, history of cardiomyopathy with ejection fraction of 25%, who comes in with:  1. Progressive increasing weakness with inability for transfers at home which is required at hemodialysis and hence has not had dialysis since this past Monday. The patient will be sent out for urgent hemodialysis given his hyperkalemia. He was seen by Dr. Cherylann Ratel in the Emergency Room. He will need a Academic librarian for [employer] lift prior to discharge due to inability to transfer at the dialysis center. We will have care management look into it. Follow-up be metabolic panel.  2. Hypertension. We will continue home medications.  3. Type 2 diabetes, on Lantus and NovoLog sliding scale three times daily before meals. We will continue that and adjust insulin Lantus as needed, according to her sugars.  4. Chronic leg edema with Unna boots, which he gets changed Tuesday and Thursday by home  health, which will be resumed at discharge.  5. Adult failure to thrive with chronic generalized weakness. The patient is mostly bedbound. I will hold off on any physical therapy at this time since the patient is bedbound.   No family members are in the Emergency Room. The patient is a FULL CODE. Further work-up according to the patient's clinical course. The hospital admission plan was discussed with the patient, who is agreeable to it.   TIME SPENT: 50 minutes. ____________________________ Wylie Hail Allena Katz, MD sap:slb D: 06/03/2012 15:04:06 ET T: 06/04/2012 08:35:14 ET JOB#: [id number]  cc: Masiyah Engen A. Allena Katz, MD, <Dictator> Karie Schwalbe, MD Willow Ora MD ELECTRONICALLY SIGNED 06/05/2012 12:45

## 2014-11-19 NOTE — Consult Note (Signed)
Consult received, await stool studies, note to be dictated later.  Pt well known to me with previous C. diff problems.  Severe diabetes can cause secretory diarrhea also, diabetic neuropathy also very possible.  Electronic Signatures: Scot JunElliott, Robert T (MD)  (Signed on 14-Aug-13 17:45)  Authored  Last Updated: 14-Aug-13 17:45 by Scot JunElliott, Robert T (MD)

## 2014-11-19 NOTE — H&P (Signed)
PATIENT NAME:  [name], [name] MR#:  [id number] DATE OF BIRTH:  1946-05-28  DATE OF ADMISSION:  02/23/2012  PRIMARY CARE PHYSICIAN: Dr. Tillman Abide    CHIEF COMPLAINT: Multiple complaints. The patient called the ambulance and was transported here because he missed his dialysis. He is complaining of occasional right foot pain, diarrhea for several months. The patient was found to have hyperkalemia.   HISTORY OF PRESENT ILLNESS: [name] is a 69 year old Caucasian male with history of noncompliance, end-stage renal disease on hemodialysis, diabetic, hypertensive. He missed his dialysis on Monday. His schedule is Monday, Wednesday, and Friday. He called the ambulance and was transported here to the Emergency Department for evaluation of severe right foot pain. After inquiring him about his foot, he tells me that this is chronic for him since he was diagnosed with diabetes. He has neuropathic pain. It comes and goes like a jolting pain, comes for a second and then goes away. The pain is located at the lateral side of the right foot. He states he just felt that the pain is worse today. It has the same characteristics and also comes as a shooting pain for a second and disappears. As far as his diarrhea, he tells me this is not new for him. He has had this for several months. The patient had spoiled the room and the hallway with his feces. Evaluation here in the Emergency Department reveals hyperkalemia. His potassium is up to 5.9. The patient was admitted for further evaluation and treatment and to involve Nephrology for dialysis.   REVIEW OF SYSTEMS: CONSTITUTIONAL: Denies any fever. No chills. No fatigue other than two days ago he felt unwell. That is why he did not feel that he should go for dialysis. EYES: No blurring of vision. No double vision. ENT: No hearing impairment. No sore throat. No dysphagia. CARDIOVASCULAR: No chest pain. No shortness of breath. No syncope. RESPIRATORY: No cough. No shortness  of breath. No chest pain. No sputum production. No hemoptysis. GASTROINTESTINAL: No abdominal pain. No vomiting but reports on and off diarrhea for several months. No hematochezia. No melena. GENITOURINARY: No dysuria. No frequency of urination. MUSCULOSKELETAL: No joint pain or swelling other than the pain in the right foot. No muscular pain or swelling. INTEGUMENTARY: No skin rash. No ulcers other than superficial ulceration on the right leg on the lateral side below the knee. This appears to be traumatic. Also, he has chronic lymphedema and swelling of both legs. NEUROLOGY: No focal weakness. No seizure activity. No headache. PSYCHIATRY: No anxiety or depression. ENDOCRINE: No polyuria or polydipsia. No heat or cold intolerance.   PAST MEDICAL HISTORY:  1. Last admission for this patient was in July 2012. At that time he had similar presentation of diarrhea and was secondary to C. difficile. His potassium was high. Again at that time he missed his dialysis. 2. The patient has end-stage renal disease on hemodialysis on Monday, Wednesday, and Friday.  3. Diabetes mellitus, on insulin. 4. Diabetic peripheral neuropathy. 5. Diabetic nephropathy. 6. Diabetic retinopathy. 7. Anemia of chronic disease.  8. Coronary artery disease, status post coronary artery bypass graft.  9. Hyperlipidemia.  10. Chronic lymphedema of lower extremities.   PAST SURGICAL HISTORY:  1. Coronary artery bypass graft.  2. AV fistula formation for his hemodialysis.   SOCIAL HABITS: Denies smoking. No history of alcoholism.   SOCIAL HISTORY: He lives at home alone, visited by three ladies. He states that one of them is a CNA. The other  two are his friends. The patient is widowed.   FAMILY HISTORY: His mother is still alive. She is 71 years old and healthy. His father died at the age of 71 from a heart attack.   ADMISSION MEDICATIONS:  1. Tylenol with Codeine #3 q.4 hours p.r.n. for pain.  2. Synthroid 50 mcg once a  day. 3. Simvastatin 80 mg a day. 4. Senokot S once a day. 5. Reglan 5 mg 4 times a day. 6. Proventil HFA 2 puffs q.4 hours p.r.n.  7. Prevacid 30 mg a day. 8. NovoLog 40 units 4 times a day. 9. Norvasc 5 mg a day.  10. Norco 5/325 1 to 2 tablets q.4 to 6 hours p.r.n.  11. Neurontin 600 mg at bedtime.  12. MiraLAX one capful daily. 13. Metoprolol succinate 25 mg once a day. 14. Lorazepam 0.5 mg as needed for anxiety. 15. Lasix 20 mg p.r.n.  16. Lantus 40 units once a day at bedtime. 17. Flomax 0.4 mg once a day.  18. Finasteride 5 mg once a day. 19. Benazepril 20 mg once a day. 20. Aspirin 325 mg a day.   ALLERGIES: Cipro.   PHYSICAL EXAMINATION:   VITAL SIGNS: Blood pressure 140/67, respiratory rate 20, pulse 78, temperature 98, oxygen saturation 97%.   GENERAL APPEARANCE: Elderly male sitting on stretcher in no acute distress.   HEAD: Mild pallor. No icterus. No cyanosis.   EARS, NOSE, AND THROAT: Hearing was normal. Nasal mucosa, lips, tongue were normal.   EYES: Normal eyelids and conjunctivae. Pupils about 5 mm, equal and reactive to light.   NECK: Supple. Trachea at midline. No thyromegaly. No cervical lymphadenopathy. No masses.   HEART: Normal S1, S2. No S3, S4. No murmur. No gallop. No carotid bruits.   LUNGS: Normal breathing pattern without use of accessory muscles. No rales. No wheezing.   ABDOMEN: Soft without tenderness. No hepatosplenomegaly. No masses. No hernias.   ABDOMEN: Obese and protruded.   SKIN: Chronic skin changes in lower extremities consistent with his chronic lymphedema. This is more prominent around the ankle and feet. There is a small superficial ulcer about 3 cm in diameter located below the right knee laterally. This appears traumatic and clean and superficial. No other subcutaneous nodules other than his chronic lymphedema changes.   MUSCULOSKELETAL: No joint swelling. No clubbing.   NEUROLOGIC: Cranial nerves II through XII are intact.  No focal motor deficit.   PSYCHIATRIC: The patient is alert, oriented to place and people. Mood and affect were flat. His mood at times is volatile. The patient is disheveled and not well kept.   LABORATORY, DIAGNOSTIC, AND RADIOLOGICAL DATA: Blood sugar 442, BUN 42, creatinine 4.4, sodium 134, potassium 5.9. Liver function tests were normal except for slight elevation of alkaline phosphatase 159. CBC showed white count of 9000, hemoglobin 11, hematocrit 36, platelet count 106.   ASSESSMENT:  1. Hyperkalemia secondary to noncompliance and he missed his dialysis. 2. End-stage renal disease on hemodialysis on Monday, Wednesday, and Friday  3. Chronic diarrhea for several months with prior history of C. difficile colitis. 4. Chronic right foot pain. This comes like lightening and subsides for a second or so. The patient tells me it's chronic but exacerbated in the last day or so.  5. Diabetes mellitus type 2 on insulin, uncontrolled.  6. Diabetic peripheral neuropathy.  7. Diabetic retinopathy and nephropathy.  8. Anemia of chronic disease.  9. Coronary artery disease, status post coronary artery bypass graft. 10. Chronic leg lymphedema.  11. Hyperlipidemia.  12. Noncompliance.   PLAN:  1. Will admit the patient to the medical floor. 2. For his hyperkalemia, he received 10 units of regular insulin along with sodium bicarbonate. I will continue treatment with Kayexalate 15 grams two doses eight hours apart until he restarts his dialysis. Nephrology consultation for starting dialysis.  3. For his right foot pain, this is chronic exacerbation likely from noncompliance with his diabetic control and uremia. Nevertheless, I noted slight elevation of alkaline phosphatase so I wanted to order x-ray of the right foot. However, the patient is refusing telling me there is no need for that, that he always has this pain. He indicates it is related to his diabetic neuropathy.  4. For his diarrhea, I will send  stool for C. difficile and culture as well. Will start him on intravenous Flagyl 500 mg q.8 hours pending results of the culture. Will continue the rest of his home medications.   I asked the patient about a LIVING WILL. He stated that he is in the process to make one but he does not have one now. Regarding power-of-attorney, he states that he has two boys and he gave them this authority but he did note have legal documentation for this purpose.   TIME SPENT EVALUATING THIS PATIENT: More than 55 minutes including reviewing his old records.   ____________________________ Carney Corners. Rudene Re, MD amd:drc D: 02/23/2012 02:37:00 ET T: 02/23/2012 07:50:32 ET JOB#: [id number]  cc: Carney Corners. Rudene Re, MD, <Dictator> Karie Schwalbe, MD Karolee Ohs Dala Dock MD ELECTRONICALLY SIGNED 02/23/2012 22:10

## 2014-11-19 NOTE — Discharge Summary (Signed)
PATIENT NAME:  [name], [name] MR#:  [id number] DATE OF BIRTH:  1945/11/28  DATE OF ADMISSION:  04/14/2012 DATE OF DISCHARGE:  04/15/2012  REASON FOR ADMISSION: Altered mental status.    FINAL DIAGNOSES: Hypoglycemia.   OTHER DIAGNOSES:  1. Hypothyroidism. 2. End-stage renal disease. 3. Coronary artery disease. 4. Insulin-dependent diabetes. 5. History of recurrent Clostridium difficile infections.  6. Diabetic retinopathy.  7. Anemia of chronic disease.  8. Hyperlipidemia.  9. Chronic lymphedema.  10. History of cerebrovascular accident with residual right-sided weakness.   HOSPITAL COURSE: [name] is a 69 year old gentleman with history of end-stage renal disease and diabetes, lymphedema who was recently seen on 09/11 at the ER due to a right leg laceration that was stapled and given Keflex for medications and patient was sent home.   He does have on top of that some vesicles on the left lower extremity. They are about 2 to 3 cm and they could break down at ay moment. The patient has severe lymphedema and he is and not showing any signs of cellulitis at the moment.   The patient came again on 04/14/2012 due to altered mental status. He had a CT scan of the head that was negative. Patient had severe mental changes which he was combative and agitated and his blood sugars were in the 30s. The patient was given D50 and admitted for follow up and evaluation.   Patient recovered his mental status at his baseline pretty quickly. He had a creatinine of 4.13, which is due to his end-stage renal disease. His blood sugars came up to 110, 95 on 04/15/2012 at 5:00 a.m. was 48 but they are back to normal now. His insulin has not been held for what we are just going to do Lantus  decrease of the dose of 35.   Other tests show alkaline phosphatase of 163, albumin of 2.9, hemoglobin 9, normal white count 6 and platelets 206. He does have INR of 1.6. Blood culture was done and was negative over 24  hours.   Patient does not make any urine for what no urine cultures were taken X-rays show trace right pleural effusion with bilateral diffuse interstitial thickening likely representing interstitial edema, which might be chronic in his case of been fluid overloaded.   Ultrasound of the lower extremities did not show any evidence of deep vein thrombosis. CT of the head did not show any abnormalities as well as the neck.   The patient was dialyzed the following day without any problems and neurological was consulted as well as psych. Psych said that it was not any psychiatric illness, mostly delirium due to the hypoglycemia and neurological did not feel to do an EEG or LP due to the fact that most of the symptoms were explained for the hypoglycemia.   Important to mention that his TSH was elevated for what levothyroxine was adjusted.   Overall, the patient is back to his normal baseline. No new findings. He has some residual right-sided weakness but is not changed from his baseline at this moment.   The patient is able to go home and we are going to add home health for treatment of laceration and vesicles that the patient has newly on his right lower extremity.   MEDICATIONS AT DISCHARGE: 1. Levothyroxine 75 mcg daily. This is increase of the dose from 50. 2. Cephalexin 250 mg twice daily.  3. Aspirin 325 mg daily. 4. Finasteride 5 mg daily.  5. Flomax 0.4 mg daily.  6. Furosemide 20 mg once daily.  7. Lorazepam 0.5 mg 3 times daily p.r.n. anxiety.  8. MiraLax 1 to 2 times daily. 9. Gabapentin 600 mg once at bedtime.  10. Norco p.r.n. pain.  11. Amlodipine 5 mg once daily.  12. NovoLog subcutaneous per sliding scale.  13. Lansoprazole 30 mg once daily.  14. Simvastatin 80 mg once daily.  15. Metoclopramide 1 q.i.d.  16. Lantus 35 units subcutaneous at bedtime. This is a decrease in the dose from the previous 40 units.   DISPOSITION: Home with home health.   FOLLOW UP: Follow up with  Dr. Alphonsus Sias in the next 1 to 2 weeks.   TIME SPENT: I spent about 38 minutes with this discharge.   ____________________________ Felipa Furnace, MD rsg:cms D: 04/15/2012 12:48:18 ET T: 04/15/2012 14:42:44 ET JOB#: [id number]  cc: Felipa Furnace, MD, <Dictator> Karie Schwalbe, MD  Pearletha Furl MD ELECTRONICALLY SIGNED 04/15/2012 15:17

## 2014-11-19 NOTE — Discharge Summary (Signed)
PATIENT NAME:  [name], [name] MR#:  [id number] DATE OF BIRTH:  1945-12-11  DATE OF ADMISSION:  02/23/2012 DATE OF DISCHARGE:  02/25/2012  HISTORY AND PHYSICAL: For a detailed note, please take a look a the History and Physical done on admission by Dr. Rudene Re.   DIAGNOSES AT DISCHARGE:  1. Acute hyperkalemia secondary to the patient missing hemodialysis. 2. End-stage renal disease, on hemodialysis. 3. Uncontrolled diabetes with episodes of hypoglycemia.  4. Severe diabetic neuropathy. 5. Chronic pain.  6. History of chronic diarrhea.  7. Hypertension.  8. Benign prostatic hypertrophy.   DIET: The patient is being discharged on an American Diabetic Association low sodium, low fat diet.   ACTIVITY: As tolerated.   FOLLOWUP:  1. Follow up with Dr. Alphonsus Sias in the next 1 to 2 weeks.  2. Follow up with Dr. Levora Dredge in 1 to 2 weeks.    DISCHARGE MEDICATIONS: .  1. Lantus 40 units at bedtime.  2. Aspirin 325 mg daily.  3. Finasteride 5 mg in the evening.  4. Flomax 0.4 mg daily.  5. Lasix 20 mg daily.  6. Lorazepam 0.5 mg t.i.d.  7. MiraLax one cap b.i.d.  8. Gabapentin 600 mg at bedtime.  9. Tylenol with hydrocodone 5/325, 1 to 2 tabs every 6 hours as needed.  10. Amlodipine 5 mg daily.  11. NovoLog sliding scale.  12. Lansoprazole 30 mg daily.  13. Simvastatin 80 mg daily.  14. Reglan 5 mg q.i.d.  15. Synthroid 50 mcg daily.  16. Simvastatin 80 mg daily.   HOSPITAL CONSULTANTS:   1. Dr. Mady Haagensen from Nephrology.  2. Dr. Levora Dredge from Vascular Surgery.   BRIEF HOSPITAL COURSE: This is a 69 year old male with medical problems as mentioned above, presented to the hospital with  neck and lower extremity pain and also noted to be hyperkalemic.   1. Acute hyperkalemia: This was likely secondary to the fact that the patient missed his hemodialysis prior to coming to the hospital. His potassium was as high as 5.9. The patient was given some Kayexalate and on top  of that was also hemodialyzed. The patient has been dialyzed twice here and his potassium since then has normalized.  2. Diarrhea: The patient did complain of some diarrhea prior to coming in, although he did not have any loose stools or diarrhea in the hospital. He has a history of chronic diarrhea and C. difficile colitis, but he was not able to give [place] a stool sample while he was in the hospital as there was no acute issue related to this.  3. Diabetes with hypoglycemia: The patient has very brittle diabetes. He is on Lantus and NovoLog sliding scale. He had a few episodes of hypoglycemia while he was here. They resolved with holding his insulin and giving him dextrose. Overnight, he has had no further episodes of hypoglycemia. He will currently be discharged back on his Lantus and sliding scale insulin as stated. The likely reason that he became hypoglycemic is because he was getting 40 units of NovoLog t.i.d. with meals, which he normally does not take.  4. Chronic pain: This is secondary to diabetic neuropathy. The patient is already on Norco as needed along with Neurontin. He will resume that.  5. Hypertension: The patient remained hemodynamically stable on his Norvasc which he will continue.  6. Hypothyroidism: The patient was maintained on Synthroid. He will resume that.  7. Hyperlipidemia: The patient was begun on simvastatin. He will resume that upon discharge, too.  8. Right upper extremity swelling: The increasing edema on the right is associated with increasing venous pressures while on dialysis. This is consistent with central venous stenosis. The patient was seen by Dr. Gilda CreaseSchnier, who recommended getting an outpatient central venogram.   CODE STATUS:  The patient is a FULL CODE.     TIME SPENT WITH DISCHARGE: 40 minutes.   ____________________________ Rolly PancakeVivek J. Cherlynn KaiserSainani, MD vjs:cbb D: 02/25/2012 15:07:39 ET T: 02/25/2012 15:44:08 ET JOB#: [id number]  cc: Rolly PancakeVivek J. Cherlynn KaiserSainani, MD,  <Dictator> Karie Schwalbeichard I. Letvak, MD [place] SirenVIVEK J SAINANI MD ELECTRONICALLY SIGNED 02/25/2012 16:20

## 2014-11-19 NOTE — H&P (Signed)
PATIENT NAME:  [name], [name] MR#:  [id number] DATE OF BIRTH:  [birth year]  DATE OF ADMISSION:  05/20/2012  REFERRING PHYSICIAN: ER physician, Dr. Carollee Massed   PRIMARY CARE PHYSICIAN: Dr. Alphonsus Sias   CHIEF COMPLAINT: Generalized weakness, feeling bad, missed dialysis.   HISTORY OF PRESENT ILLNESS: The patient is a 69 year old male with past medical history of end-stage renal disease, recurrent Clostridium difficile, insulin dependent diabetes, anemia of chronic disease, and hypothyroidism who was recently in [facility] from 10/01 to 05/03/2012 for uncontrolled diabetes. He was treated in the ICU with insulin drip and subsequently signed himself out AGAINST MEDICAL ADVICE. He reports that he has been feeling badly for the last few days and missed his dialysis on Friday. He was feeling bad this morning, therefore, he called EMS who found that he was hypoglycemic with an Accu-Chek of 17. They gave him one amp of D50 with improvement in his blood sugar. The patient does not know if he is having any further diarrhea. As per the ED nurse, he did not have any stool in his diaper, however, he had some raw areas on his buttocks. He also has lower extremity Unna boots. The patient reports that his PCP placed them for chronic lymphedema, that he also has a wound on his right lower extremity   PAST MEDICAL HISTORY:  1. End-stage renal disease on dialysis Monday, Wednesday, and Friday. 2. Recurrent C. difficile colitis. 3. Insulin dependent diabetes. Last hemoglobin A1c was 7.9. 4. Peripheral neuropathy.  5. Diabetic retinopathy. 6. Anemia of chronic disease.  7. Coronary artery disease, status post coronary artery bypass graft.  8. Hyperlipidemia.  9. Chronic lower extremity lymphedema.  10. History of cerebrovascular accident with residual right-sided weakness. 11. Hypothyroidism. Last TSH was 7.82.    PAST SURGICAL HISTORY:  1. Status post cataract surgery. 2. Coronary artery bypass graft. 3. Right arm AV  fistula.   CURRENT MEDICATIONS:  1. Vicodin 5/325 1 to 2 tablets q.6 hours p.r.n.  2. Amlodipine 5 mg daily.  3. Aspirin 325 mg daily.  4. Finasteride 5 mg daily.  5. Lasix 20 mg daily.  6. Neurontin 600 mg daily.  7. Prevacid 30 mg daily.  8. Lantus 35 units subcutaneously once a day. 9. Synthroid 75 mcg daily.  10. Ativan 0.5 mg t.i.d. p.r.n.  11. Reglan 5 mg q.i.d. a.c.  12. MiraLAX 17 grams daily.  13. NovoLog insulin sliding scale.  14. Simvastatin 80 mg daily.  15. Flomax 0.4 mg daily.    ALLERGIES: Cipro.   FAMILY HISTORY: Mother is healthy, living at 54. Father died in his 66's from MI.   SOCIAL HISTORY: The patient lives by himself. He is bedbound and gets around using a wheelchair. He has a 24-hour/7 day a week caregiver. He quit smoking in 2002. Denies any alcohol or drug abuse. Has two sons who live nearby.   PHYSICAL EXAMINATION:   VITAL SIGNS: Temperature 97.8, heart rate 47, respiratory rate 18, blood pressure 130/64, pulse oximetry 96% on room air.   GENERAL: The patient is a 69 year old chronically ill appearing male laying comfortably in bed not in acute distress.   HEAD: Atraumatic, normocephalic.   EYES: There is pallor. No icterus or cyanosis. Pupils equal, round, and reactive to light and accommodation. Extraocular movements intact.    ENT: Wet mucous membranes. No oropharyngeal erythema or thrush.   NECK: Supple. No masses. No JVD. No thyromegaly.   CHEST WALL: No tenderness to palpation. Not using accessory muscles of respiration. No  intercostal muscle retraction.   LUNGS: Bilateral basal crepitations. No wheezing, rales, or rhonchi.  CARDIOVASCULAR: Regular. No murmurs, rubs, or gallop.   ABDOMEN: Soft, nontender, nondistended. No guarding or rigidity. No organomegaly. Normal bowel sounds.   SKIN: The patient has raw area/excoriation on his buttocks. He has bilateral lower extremity Unna boots.   PERIPHERIES: There is some pedal edema. The  patient has bilateral lower extremity Unna boots. 1+ pedal pulses.   MUSCULOSKELETAL: No cyanosis or clubbing.   NEUROLOGIC: Awake, alert, oriented x3. Nonfocal neurological exam. Cranial nerves grossly intact. Generalized weakness.   PSYCH: Normal mood and affect.   LABORATORY, DIAGNOSTIC, AND RADIOLOGICAL DATA: Fingerstick Accu-Chek 109.   Chest x-ray atelectasis right lung base, small amount of pleural effusion.   Glucose 88, BUN 51, creatinine 5, sodium 139, potassium 5.7, chloride 105, CO2 23, calcium 8.8, bilirubin 0.3, alkaline phosphatase 179, ALT 36, AST 55, white count 6.8, hemoglobin 10.4, hematocrit 32.2, platelet count 251.  ASSESSMENT AND PLAN: This is a 69 year old male with history of end-stage renal disease, recurrent Clostridium difficile, and insulin dependent diabetes who presents with hypoglycemia.  1. Hypoglycemia. The patient reports that he last took his insulin last night. This morning he felt weak, therefore, he called EMS who found his Accu-Chek to be 17. He was given one amp of glucose with improvement in his serum glucose level. The patient takes insulin sliding scale and Lantus 35 units a day at home. Will place him on a diabetic diet and continue the insulin sliding scale. Will hold his Lantus for the time being. His last hemoglobin A1c was 7.9.  2. Sinus bradycardia. The patient is not on any rate lowering medications. Will monitor patient on telemetry.  3. End-stage renal disease. The patient missed his dialysis on Friday. Dr. Thedore MinsSingh has been contacted and the patient is going to get dialyzed today.  4. Anemia. This is likely of chronic disease, stable as compared to prior levels.  5. Hyperkalemia likely due to missing dialysis on Friday. The patient is going to be dialyzed today and hopefully that will improve the level. Will monitor patient on telemetry.  6. Diarrhea. The patient has history of recurrent Clostridium difficile colitis. During his last admission  also he had diarrhea but left before additional work-up could be done. Will check a C. difficile and stool studies.  7. Wounds. The patient has excoriated areas on his buttocks. He is also wearing lower extremity Unna boots and reports that he has a wound on his right lower extremity. Will get Wound Care consultation.  8. Hypothyroidism. The patient's last TSH was 7.86 and was on the lower side. I do not know if his medications got adjusted. The patient reports that his medications are basically the same as he was taking during his last hospitalization. Will recheck a TSH. If the TSH continues to be high, the dose of his Synthroid may need to be increased.  9. Hyperlipidemia. will continue the patient's statin.  10. Diabetic neuropathy. Will continue the patient's Neurontin. 11. Gastroesophageal reflux disease. Will continue Prevacid.   Reviewed all medical records, discussed with the ED physician, discussed with the patient the plan of care and management.   TIME SPENT: 45 minutes.    ____________________________ Darrick MeigsSangeeta Tasia Liz, MD sp:drc D: 05/20/2012 14:32:15 ET T: 05/20/2012 15:04:11 ET JOB#: [id number] cc: Darrick MeigsSangeeta Bucky Grigg, MD, <Dictator>, Karie Schwalbeichard I. Letvak, MD Darrick MeigsSANGEETA Carmencita Cusic MD ELECTRONICALLY SIGNED 05/20/2012 16:57

## 2014-11-19 NOTE — Consult Note (Signed)
Present Illness The patient is a 69 year old male with history of noncompliance, end-stage renal disease on hemodialysis, diabetic, hypertensive. He missed his dialysis on Monday. His schedule is Monday, Wednesday, and Friday. He presented to [facility] for evaluation of severe right foot pain. He has a history of neuropathic pain. Over the past several days his pain has improved.  Yesterday it was noted ofn dialysis that his venous pressures were high and it was also noted that his right arm is swollen.  He is complaining of tightness and pain in the right arm.   PAST MEDICAL HISTORY:  1. Last admission for this patient was in July 2012. At that time he had similar presentation of diarrhea and was secondary to C. difficile. His potassium was high. Again at that time he missed his dialysis. 2. The patient has end-stage renal disease on hemodialysis on Monday, Wednesday, and Friday.  3. Diabetes mellitus, on insulin. 4. Diabetic peripheral neuropathy. 5. Diabetic nephropathy. 6. Diabetic retinopathy. 7. Anemia of chronic disease.  8. Coronary artery disease, status post coronary artery bypass graft.  9. Hyperlipidemia.  10. Chronic lymphedema of lower extremities.   PAST SURGICAL HISTORY:  1. Coronary artery bypass graft.  2. AV fistula formation for his hemodialysis.   Home Medications: Medication Instructions Status  Lantus 100 units/mL subcutaneous solution 40 unit(s) subcutaneous once a day (at bedtime) Active  aspirin 325 mg oral tablet 1 tab(s) orally once a day (in the evening) Active  finasteride 5 mg oral tablet 1 tab(s) orally once a day (in the evening) Active  tamsulosin 0.4 mg oral capsule 1 cap(s) orally once a day (in the evening) Active  furosemide 20 mg oral tablet 1 tab(s) orally once a day (in the evening) Active  lorazepam 0.5 mg oral tablet 1 tab(s) orally 3 times a day, As Needed- for Anxiety, Nervousness  Active  MiraLax oral powder for reconstitution 1 cap(s) orally 1  to 2 times a day Active  gabapentin 600 mg oral tablet 1 tab(s) orally once a day (at bedtime) Active  acetaminophen-hydrocodone 325 mg-5 mg oral tablet 1-2 tab(s) orally every 6 hours, As Needed- for Pain  Active  amlodipine 5 mg oral tablet 1 tab(s) orally once a day (in the evening) Active  NovoLog 100 units/mL subcutaneous solution  subcutaneous per sliding scale Active  lansoprazole 30 mg oral delayed release capsule 1 cap(s) orally once a day (in the evening) Active  simvastatin 80 mg oral tablet 1 tab(s) orally once a day (at bedtime) Active  Synthroid 50 mcg (0.05 mg) oral tablet 1 tab(s) orally once a day (in the evening) Active  metoclopramide 5 mg oral tablet 1 tab(s) orally 4 times a day (before meals and at bedtime), As Needed- for Indigestion, Heartburn  Active    Cipro: Itching  Case History:   Family History Non-Contributory    Social History negative tobacco, negative ETOH, negative Illicit drugs   Review of Systems:   Fever/Chills No    Cough No    Sputum No    Abdominal Pain No    Diarrhea No    Constipation No    Nausea/Vomiting No    SOB/DOE No    Chest Pain No    Telemetry Reviewed NSR    Tolerating PT No    Tolerating Diet Yes   Physical Exam:   GEN well developed, well nourished, no acute distress    HEENT pink conjunctivae, PERRL, moist oral mucosa    NECK supple  trachea midline    RESP normal resp effort  no use of accessory muscles    CARD regular rate  no JVD    VASCULAR ACCESS AV fistula present  Good bruit  Good thrill  marked increase in pulsatility    ABD denies tenderness  soft    EXTR right arm with significant size increase compared with the left    NEURO cranial nerves intact, follows commands, motor/sensory function intact    PSYCH alert, A+O to time, place, person   Nursing/Ancillary Notes: **Vital Signs.:   26-Jul-13 06:01   Vital Signs Type Routine   Temperature Temperature (F) 97.4   Celsius 36.3    Temperature Source Oral   Pulse Pulse 63   Respirations Respirations 18   Systolic BP Systolic BP 403   Diastolic BP (mmHg) Diastolic BP (mmHg) 70   Mean BP 87   Pulse Ox % Pulse Ox % 92   Pulse Ox Activity Level  At rest   Oxygen Delivery Room Air/ 21 %   Routine Chem:  25-Jul-13 05:29    Glucose, Serum  134   BUN  69   Creatinine (comp)  6.37   Sodium, Serum  147   Potassium, Serum  3.2   Chloride, Serum  115   CO2, Serum  20   Calcium (Total), Serum  8.4   Anion Gap 12   Osmolality (calc) 315   eGFR (African American)  10   eGFR (Non-African American)  8 (eGFR values <29mL/min/1.73 m2 may be an indication of chronic kidney disease (CKD). Calculated eGFR is useful in patients with stable renal function. The eGFR calculation will not be reliable in acutely ill patients when serum creatinine is changing rapidly. It is not useful in  patients on dialysis. The eGFR calculation may not be applicable to patients at the low and high extremes of body sizes, pregnant women, and vegetarians.)  26-Jul-13 08:30    Phosphorus, Serum 4.4 (Result(s) reported on 25 Feb 2012 at 10:01AM.)  Routine Hem:  26-Jul-13 08:30    WBC (CBC) 6.2   RBC (CBC)  3.18   Hemoglobin (CBC)  10.8   Hematocrit (CBC)  32.2   Platelet Count (CBC)  146   MCV  102   MCH 33.9   MCHC 33.4   RDW 13.5   Neutrophil % 63.1   Lymphocyte % 17.5   Monocyte % 13.7   Eosinophil % 4.9   Basophil % 0.8   Neutrophil # 3.9   Lymphocyte # 1.1   Monocyte # 0.8   Eosinophil # 0.3   Basophil # 0.1 (Result(s) reported on 25 Feb 2012 at 10:01AM.)     Impression 1. Right arm swelling suggesting central venous stenosis.  Patient will need a fiatulagram with the intention for intervention which can be done as an outpatient.  Risks and benefits reviewed with the patient all questions answered.  He agrees to procced. 2. End-stage renal disease on hemodialysis on Monday, Wednesday, and Friday HD will continue for now via  the fistula 3. Chronic diarrhea for several months with prior history of C. difficile colitis. plan per medical service 4. Chronic right foot pain.  likely neuropathic neurontin and narcotics as needed 5. Diabetes mellitus type 2 on insulin, uncontrolled.  6. Diabetic peripheral neuropathy. see number 5  7. Coronary artery disease, status post coronary artery bypass graft continue home meds nitrates as needed 8. Anemia of chronic disease. transfuse if Hgb<7.0 given his coronary disease  .  Plan Level 4 consult   Electronic Signatures: Hortencia Pilar (MD)  (Signed 26-Jul-13 18:04)  Authored: General Aspect/Present Illness, Home Medications, Allergies, History and Physical Exam, Vital Signs, Labs, Impression/Plan   Last Updated: 26-Jul-13 18:04 by Hortencia Pilar (MD)

## 2014-11-19 NOTE — Discharge Summary (Signed)
PATIENT NAME:  [name], [name] MR#:  [id number] DATE OF BIRTH:  [birth year]  DATE OF ADMISSION:  06/20/2012 DATE OF DISCHARGE:  06/27/2012  The patient had an interim summary dictated on 06/25/2012 by Dr. Katharina Caperima Vaickute. Please refer to the interim summary. This covers 11/24 through 06/27/2012.  ADMITTING DIAGNOSIS: Metabolic encephalopathy due to hypoglycemia.  DISCHARGE DIAGNOSES:  1. Metabolic encephalopathy due to hypoglycemia, resolved.  2. Questionable bacterial pneumonia.  3. Questionable left lower extremity cellulitis.  4. End stage renal disease, on hemodialysis.  5. Systemic antiinflammatory response syndrome.    CONSULTANTS:  1. Ned GraceNancy Phifer, MD. 2. Kristine LineaJolanta Pucilowska, MD. 3. Festus BarrenJason Dew, MD. 4. Mosetta PigeonHarmeet Singh, MD. 5. Hulda Marinimothy Oaks, MD.  HOSPITAL COURSE: This is a 69 year old male who presented with hypoglycemia. For further details, please refer to the history and physical.  1. Hypoglycemia with metabolic encephalopathy. The patient was off insulin. His symptoms have resolved. He could also have urinary tract infection, pneumonia, and SIRS contributing to his metabolic encephalopathy. He is now back to his baseline.  2. Hypoglycemia, resolved.  3. Bacterial pneumonia. The patient was on vancomycin and Zosyn then changed to Zithromax.  4. Left lower extremity cellulitis. The patient was initially on antibiotics. These have been discontinued. No evidence of cellulitis at this time.  5. End-stage renal disease, on hemodialysis. The patient did have dialysis while in the hospital.  6. Diarrhea, which resolved.  7. SIRS with hypotension. The patient improved with IV fluids.  8. Pressure ulcer due to Northwest AirlinesUnna boots. Appreciate Dr. Thelma Bargeaks consultation. The patient will be seen by Dr. Thelma Bargeaks as an outpatient.  9. Right upper extremity swelling. The patient has a nonocclusive thrombus within the internal carotid, however, Dr. 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 He is on aspirin  therapy.   DISCHARGE MEDICATIONS:  1. Aspirin 325 mg daily.  2. Finasteride 5 mg daily.  3. Tamsulosin 0.4 mg at bedtime.  4. Lasix 20 mg at bedtime.  5. Norvasc 5 mg daily.  6. Lansoprazole LA 30 mg at bedtime.  7. Simvastatin 80 mg at bedtime.  8. Reglan 5 mg four times daily p.r.n.  9. Synthroid 137 mcg daily.  10. Acetaminophen 325 mg 2 tablets every four hours p.r.n. pain.  11. Gabapentin 600 mg at bedtime.  12. NovoLog subcutaneous three times daily.  13. Lantus 15 units at bedtime.  14. Lorazepam 0.5 mg as needed for anxiety.  15. Aspirin 81 mg daily.     DISCHARGE DIET: Low sodium, ADA diet.   DISCHARGE ACTIVITY: As tolerated.   DISCHARGE REFERRAL: Physical therapy.            DISCHARGE FOLLOWUP: Followup with Dr. Thelma Bargeaks in two weeks and Dr. Wyn Quakerew in six weeks.   TIME SPENT: Approximately 35 minutes.  ____________________________ Janyth ContesSital P. Juliene PinaMody, MD spm:slb D: 06/27/2012 12:52:02 ET T: 06/27/2012 14:23:03 ET JOB#: [id number]  cc: Nayeliz Hipp P. Juliene PinaMody, MD, <Dictator> Janyth ContesSITAL P Ziquan Fidel MD ELECTRONICALLY SIGNED 06/28/2012 11:26

## 2014-11-19 NOTE — Consult Note (Signed)
Brief Consult Note: Diagnosis: cad/diabetes melitus/esrd/hemodialysis/cabg.   Patient was seen by consultant.   Consult note dictated.   Recommend further assessment or treatment.   Comments: Pt with history of esrd on hemodialysis, history of cad s/p cabg, history of cva who was admitted after becoming unresponsive during dialysis. He was noted to be hypoglycemic with glucose in the 25-30 range. He improved with D50. He has a mild troponin elevation but denies any chest pain. EKG is at baseline. SYmptoms appear to be secondary to his hypoglycemia and he does not appear to have had an arrythmia or ischemic event. Echo is pending. Would continue with current medical therapy. Does not appear to require any further invasive or noninvasive cardiac workup at this point. FUll note to follow.  Electronic Signatures: Dalia HeadingFath, Kenneth A (MD)  (Signed 21-Nov-13 15:08)  Authored: Brief Consult Note   Last Updated: 21-Nov-13 15:08 by Dalia HeadingFath, Kenneth A (MD)

## 2014-11-19 NOTE — Consult Note (Signed)
Brief Consult Note: Diagnosis: Altered mental status.   Patient was seen by consultant.   Comments: [name] has no psychiatric history. He is not "crazy" and refuses to see a psychiatrist. Will try again tomorow.  Electronic Signatures: Kristine LineaPucilowska, Alaycia Eardley (MD)  (Signed [phone]-Nov-13 17:52)  Authored: Brief Consult Note   Last Updated: 20-Nov-13 17:52 by Kristine LineaPucilowska, Terris Germano (MD)

## 2014-11-19 NOTE — Discharge Summary (Signed)
PATIENT NAME:  EMMAUS, BRANDI MR#:  [id number] DATE OF BIRTH:  06-Jan-1946  DATE OF ADMISSION:  05/20/2012 DATE OF DISCHARGE:  05/22/2012  DISCHARGE DIAGNOSES:  1. Hypoglycemia and sinus bradycardia.  2. Recurrent Clostridium difficile.   3. Diabetes mellitus type 2. 4. End-stage renal disease.  5. Hyperkalemia.  6. Bilateral chronic leg wounds. 7. Hypothyroidism.   DISCHARGE MEDICATIONS:  1. Aspirin 325 mg p.o. daily. 2. Finasteride 5 mg p.o. daily.  3. Tamsulosin 0.4 mg daily.  4. Furosemide 20 mg daily. 5. Ativan 0.5 mg p.o. t.i.d. as needed for nervousness.  6. Percocet 5/325, 1 to 2 tablets q.6 hours p.r.n. as needed for pain. 7. Amlodipine 5 mg daily.  8. NovoLog subcutaneous as per sliding scale.  9. Omeprazole 20 mg p.o. daily.  10. Simvastatin 80 mg daily. 11. Neurontin 300 mg 2 capsules once a day. 12. Levothyroxine 137 mcg p.o. daily.  13. Lantus dose is decreased to 15 units at bedtime because of hypoglycemic events. Patient was taking Lantus 35 units, that has decreased. 14. Synthroid also increased to 137 because of elevated TSH.  15. Patient also started on  dificid 200 mg p.o. b.i.d. for 10 days because recurrent Clostridium difficile.   CONSULTANTS:  1. Nephrology consultation. Dialysis with [facility] on Monday, Wednesday, Friday.  2. Patient is supposed to see the wound care people but he did not wait.  3. Patient followed by Dr. Thedore Mins as well.   HOSPITAL COURSE: This is a 69 year old male patient with multiple medical problems of ESRD, diabetes, hypertension, bilateral chronic leg wound, history of cerebrovascular accident and also hypothyroidism admitted on 10/19 because of generalized weakness. Patient's blood sugar was 17 on admission. Look at the history and physical for full details. Patient had diarrhea the day before admission and he continued to take his Lantus despite having poor p.o. intake and diarrhea so he ended up here with blood glucose of 17 and  patient admitted for hypoglycemia. Lantus was held and stool for Clostridium difficile was sent. Patient's hypoglycemia resolved and blood sugar nicely went up to 110-115. Patient started to eat. No further episodes of diarrhea. Stool for C. difficile has been positive for Clostridium difficile and Dificid has been started and he can continue that for 10 days. Patient's Lantus dose is decreased to 15 units and advised him not to take the Lantus if he gets sick in terms of diarrhea and not eat well.   Patient has chronic leg wounds and he is followed by wound care people before but here wound care consult has been placed but patient refused to wait and he is followed by Dr. Alphonsus Sias and had Unna boots change a week ago.    Hypothyroidism. TSH is elevated here at 21 so I increased the dose of Synthroid.   Hyperkalemia. Potassium was 5.7 on admission so patient is taken for dialysis. Repeat potassium 5.2.  History of cerebrovascular accident with residual right-sided weakness. He is wheelchair bound.    History of recurrent C. difficile. That is why he is started on Dificid for 10 days this time and he needs repeat stool cultures.   Sinus bradycardia. Patient asymptomatic and his heart rate was in 50s when he came and after increasing the dose of Synthroid his heart rate nicely improved to 60s and then at the time of discharge is 82 and he is not on any beta blockers and rate controlling medication so sinus bradycardia was asymptomatic likely secondary to severe hypothyroidism.   TIME  SPENT ON DISCHARGE PREPARATION: More than 30 minutes.  ____________________________ Katha HammingSnehalatha Kyal Arts, MD sk:cms D: 05/23/2012 08:48:43 ET T: 05/23/2012 10:56:21 ET  JOB#: [id number] cc: Katha HammingSnehalatha Aarion Metzgar, MD, <Dictator> Katha HammingSNEHALATHA Lilybelle Mayeda MD ELECTRONICALLY SIGNED 06/03/2012 22:48

## 2014-11-19 NOTE — Consult Note (Signed)
PATIENT NAME:  [name], [name] MR#:  [id number] DATE OF BIRTH:  Jun 10, 1946  DATE OF CONSULTATION:  03/16/2012  REFERRING PHYSICIAN:   CONSULTING PHYSICIAN:  Miguel Jun, MD  HISTORY OF PRESENT ILLNESS: The patient is a 69 year old white male with chief complaint of weakness and diarrhea who is on end-stage renal dialysis Monday, Wednesday, Fridays. He has been off and on not compliant with getting his dialysis done. He is a very difficult historian because his memory is very poor so details of his illness should be taken with a grain of salt.   He was seen by me a year ago and had C. difficile diarrhea. He does now complain of off and on diarrhea. Some days he will go and some days he won't. The hospitalist recorded that he has had diarrhea for six months with flares worsening from time to time. He denied any chills, fever, or abdominal pain per se.    He does have a history of congestive heart failure and was noted to be have a sat at 80% on room air in the ER and a chest x-ray showed pulmonary edema so he was admitted to the hospital.   PAST MEDICAL HISTORY:  1. Cerebrovascular accident with right side weakness.  2. End-stage renal dialysis Monday, Wednesday, Friday dialyzed. 3. Previous C. difficile colitis. 4. Insulin dependent diabetes.  5. Peripheral neuropathy of severe nature.  6. Diabetic retinopathy.  7. Anemia of chronic disease.  8. Coronary artery disease, status post coronary bypass graft surgery.  9. Hyperlipidemia.  10. Chronic lymphedema.  11. Inability to walk.   PAST SURGICAL HISTORY:  1. Coronary bypass graft surgery in 2002. 2. Recurrent surgeries for dialysis fistulas.  3. Cataract surgery.   ALLERGIES: No known drug allergies.   MEDICATIONS AT HOME:  1. Norco 325/5 1 to 2 tablets q.6 hours p.r.n.  2. Norvasc 5 mg a day.  3. Aspirin 325 mg a day.  4. Finasteride 5 mg a day.  5. Furosemide 20 mg a day.  6. Gabapentin 600 mg a day.  7. Lansoprazole 30  mg a day.  8. Lantus insulin 40 units sub-Q at bedtime.  9. Lorazepam 0.5 mg t.i.d. p.r.n.  10. Reglan 5 mg 4 times a day.  11. MiraLAX powder p.r.n. for constipation.  12. NovoLog sliding scale insulin.  13. Simvastatin 80 mg at bedtime.  14. Synthroid 50 mcg a day.  15. Flomax 0.4 mg daily.   SOCIAL HISTORY: The patient is bedbound and lives by himself. He has 24/7 caregivers.   HABITS: Used to smoke 3 to 4 packs a day for 35 years, quit in 2002. Denies alcohol use. The patient is divorced. Previous spouse was a CCU nurse here at the hospital   FAMILY HISTORY: Mother is alive at the age of 7. Father died in his 85's from heart disease.  REVIEW OF SYSTEMS: He has glasses he uses after he had his cataract surgeries. No history of glaucoma. No asthma, wheezing, or coughing. No heavy pressure-like chest pains. No vomiting or abdominal pain. He although on dialysis still does urinate. No hematuria. No hematemesis. No hematochezia He has weakness on his right side from previous stroke. Is unable to walk. Has wrappings on his legs.   PHYSICAL EXAMINATION:   GENERAL: White male in no acute distress, looks much older than stated age.   VITAL SIGNS: Temperature 97.8, pulse 98, respirations 18, blood pressure 150/68.   HEENT: Sclerae are nonicteric. Conjunctivae negative. Tongue negative.  HEAD: Atraumatic.   NECK: Trachea is in the midline.   CHEST: Has bilateral crackles inferiorly and posteriorly. Decreased air flow globally. There is a 2/6 systolic murmur.   ABDOMEN: Soft, nontender. No masses. Bowel sounds present.   EXTREMITIES: 2+ edema. Chronic lymphedema. Has bandages on his legs I did not undo.   NEUROLOGIC: The patient seems awake, alert, oriented, knows me.   LABORATORY, DIAGNOSTIC, AND RADIOLOGICAL DATA: Sodium 140, potassium 4.9, chloride 105, bicarb 19, BUN 45, creatinine 4.8, glucose 166, calcium 8.7, ALT 25, AST 35, alkaline phosphatase 165, total bilirubin 0.3,  albumin 3.1. Troponin 0.24. TSH 4. White count 6, hemoglobin 10.6, hematocrit 33, platelet count 202.   Chest x-ray showed mild cardiomegaly with interstitial infiltrates.   ASSESSMENT AND PLAN: I am consulted for chronic diarrhea. Patients with end-stage renal disease or patients with severe diabetes sometimes have diarrhea not due to infectious causes. Sometimes uremia produces mucosal erosions or inflammation. Of course, diabetes can produce neuropathy which can cause bowel dysfunction, sometimes diarrhea, sometimes constipation. Whenever a patient has had C. difficile, that is certainly a reasonable consideration as cause for any recurrent bouts of diarrhea. His poor memory makes it difficult to categorize the diarrhea on a historical basis. His bout of congestive heart failure makes me hold off on doing any endoscopic procedures at this time to examine the colon lining and get mucosa for sampling.    PLAN:  1. Await stool studies.  2. After the congestive heart failure is improved, will consider flexible sigmoidoscopy for bowel mucosal examination and sampling to rule out microscopic colitis such as collagenous colitis, lymphocytic colitis, or ischemic colitis. 3. Await stool cultures as well.     I will follow with you.   ____________________________ Miguel Junobert T. Elliott, MD rte:drc D: 03/16/2012 08:02:40 ET T: 03/16/2012 08:46:55 ET JOB#: [id number]  cc: Miguel Junobert T. Elliott, MD, <Dictator> Karie Schwalbeichard I. Letvak, MD Munsoor Lizabeth LeydenN. Lateef, MD Miguel JunOBERT T ELLIOTT MD ELECTRONICALLY SIGNED 03/21/2012 18:33

## 2014-11-19 NOTE — Discharge Summary (Signed)
PATIENT NAME:  [name], [name] MR#:  [id number] DATE OF BIRTH:  [birth year]  DATE OF ADMISSION:  06/20/2012 DATE OF DISCHARGE:  06/28/2012  ADDENDUM   Patient is actually being discharged on 06/28/2012. Everything has remained the same. I do want to add for his wound of his lower extremity we need to keep the dressing dry, daily dressing changes . No other changes.   ____________________________ Janyth ContesSital P. Juliene PinaMody, MD spm:cms D: 06/28/2012 09:29:26 ET T: 06/28/2012 09:34:58 ET JOB#: [id number]  cc: Carmello Cabiness P. Rahim Astorga, MD, <Dictator>  Entered as incorrect report type; entered as history and physical and should be discharge summary. Molina Hollenback P Natalyia Innes MD ELECTRONICALLY SIGNED 06/28/2012 11:26

## 2014-11-19 NOTE — Consult Note (Signed)
Chief complaint is diarrhea.  Pt without diarrhea today.  I doubt active C.diff infection.  He is on [place] and miralax.  Abd not tender today. Will follow  Electronic Signatures: Scot JunElliott, Micaela Stith T (MD)  (Signed on 15-Aug-13 18:39)  Authored  Last Updated: 15-Aug-13 18:39 by Scot JunElliott, Greyson Peavy T (MD)

## 2014-11-19 NOTE — Op Note (Signed)
PATIENT NAME:  [name], [name] MR#:  [id number] DATE OF BIRTH:  Jan 14, 1946  DATE OF PROCEDURE:  03/16/2012  PREOPERATIVE DIAGNOSES:  1. End-stage renal disease.  2. Poorly functioning left arm AV fistula and significant arm swelling.  3. Congestive heart failure.  4. Hypertension.   POSTOPERATIVE DIAGNOSES:  1. End-stage renal disease.  2. Poorly functioning left arm AV fistula and significant arm swelling.  3. Congestive heart failure.  4. Hypertension.   PROCEDURES:  1. Ultrasound guidance for vascular access to right brachiobasilic AV fistula.  2. Right upper extremity fistulogram.  3. Percutaneous transluminal angioplasty of banded area of right arm AV fistula with 5 and 6 mm diameter angioplasty balloon.   SURGEON: Annice NeedyJason S. Dew, MD  ANESTHESIA: Local with moderate conscious sedation.   ESTIMATED BLOOD LOSS: Minimal.   CONTRAST USED: 28 mL Visipaque.   INDICATION FOR PROCEDURE: This is a 69 year old gentleman with multiple medical comorbidities who is in the hospital with congestive heart failure. He was scheduled as an outpatient for fistulogram due to poorly functioning right arm AV fistula and significant right arm swelling. Fistulogram is planned as he has been medically stabilized. Risks and benefits were discussed. Informed consent was obtained.   DESCRIPTION OF PROCEDURE: Patient is brought to the vascular interventional radiology suite, right upper extremity sterilely prepped and draped, sterile surgical field was created. Ultrasound was used to visualize the fistula and access this just beyond the anastomosis with a micropuncture needle, micropuncture wire and sheath were placed. Imaging performed through the micropuncture sheath showed that the right arm AV fistula was patent with the exception of the area that had been previously banded but this was very tightly stenotic and there was significant reflux back through a basilic vein branch into the forearm. 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 He had profound ischemia of the right hand with the initial placement of the fistula several years ago prior to banding. There was improvement but still the area remained constrained enough to limit flow somewhat. At this point I elected to terminate the procedure. The sheath was removed around a 4-0 Monocryl pursestring suture. Pressure was held. Sterile dressing was placed. The patient tolerated procedure well and was taken to the recovery room in stable condition.   ____________________________ Annice NeedyJason S. Dew, MD jsd:cms D: 03/16/2012 16:42:00 ET T: 03/17/2012 09:57:37 ET JOB#: [id number]  cc: Annice NeedyJason S. Dew, MD, <Dictator>  Annice NeedyJASON S DEW MD ELECTRONICALLY SIGNED 03/21/2012 8:28

## 2014-11-19 NOTE — H&P (Signed)
PATIENT NAME:  [name], [name] MR#:  [id number] DATE OF BIRTH:  1945/11/01  DATE OF ADMISSION:  05/02/2012  PRIMARY MD: Dr. Alphonsus Sias   CHIEF COMPLAINT: Generalized weakness, diarrhea.   HISTORY OF PRESENT ILLNESS: The patient is a 69 year old white male with end-stage renal disease, diabetes, and chronic lymphedema who was hospitalized here from 09/13 to 09/14 with altered mental status due to hypoglycemia. At that time his insulin was decreased from 40 units Lantus to 35 units. The patient was discharged to home. He reports that he started feeling bad yesterday at the end of his dialysis and had to stop dialysis 15 minutes earlier. He also started experiencing diarrhea and was feeling very weak. He continued to feel bad throughout the day and the diarrhea persisted. The patient came to the ED. In the ER he was noted to have blood sugars in the 600's and I was asked to admit the patient. The patient otherwise denies any fevers or chills. No chest pains. No shortness of breath. No abdominal pain. No nausea, vomiting, or diarrhea. The patient reports that he did take his insulin yesterday.   PAST MEDICAL HISTORY:  1. End-stage renal disease, on hemodialysis Monday, Wednesday, Friday. 2. Recurrent C. difficile.  3. Insulin-dependent diabetes.  4. Peripheral neuropathy.  5. Diabetic retinopathy. 6. Anemia of chronic disease. 7. Coronary artery disease, status post CABG. 8. Hyperlipidemia. 9. Chronic lymphedema. 10. History of CVA with residual right-sided weakness.    PAST SURGICAL HISTORY:  1. Status post cataract surgery. 2. Status post CABG. 3. Status post AV fistula in the right arm.    CURRENT MEDICATIONS:  1. Acetaminophen/hydrocodone 325/5 1 to 2 tabs q.6 p.r.n. pain.  2. Amlodipine 5 daily.  3. Aspirin 325 p.o. daily.  4. Finasteride 5 mg daily.  5. Lasix 20 one tab p.o. in the evening. 6. Gabapentin 600 one tab p.o. at bedtime.  7. Lansoprazole 30 one tab p.o. daily.  8. Lantus  35 units at bedtime.  9. Levothyroxine 75 mcg daily.  10. Lorazepam 0.5 one tab p.o. t.i.d. as needed.  11. Reglan 5 mg 4 times per day as needed for indigestion and heartburn. 12. MiraLAX 17 grams 1 tab p.o. b.i.d.  13. NovoLog sliding scale insulin.  14. Simvastatin 80 at bedtime.  15. Flomax 0.4 daily.  16. Vicodin 1 tab 4 times per day as needed.   ALLERGIES: Cipro.   SOCIAL HISTORY: The patient lives at home by himself. He is bedbound and gets around using a wheelchair. He has a 24 hour/7 days a week caregiver. He is a former smoker, quit in 2002. No alcohol use.   FAMILY HISTORY: Per records, mom healthy living at 95. Father died in his 68's from heart attack.   REVIEW OF SYSTEMS: CONSTITUTIONAL: Denies any fevers. Complains of fatigue, weakness, chronic pain. No weight loss. No weight gain. EYES: No blurred or double vision. No redness. No inflammation. ENT: No tinnitus. No ear pain. No hearing loss. No seasonal or year-round allergies. RESPIRATORY: Denies any cough, wheezing, hemoptysis. No COPD. No TB. CARDIOVASCULAR: Denies any chest pain. No orthopnea. No arrhythmias. GI: Denies any nausea or vomiting. Complains of diarrhea. No abdominal pain. No hematemesis. No melena. No gastroesophageal reflux disease. No IBS. GU: Denies any dysuria, hematuria, renal calculus, or frequency. ENDOCRINE: Denies any polyuria or nocturia. Does have hypothyroidism. HEME/LYMPH: Has anemia of chronic disease. SKIN: Has no acne. No rash. MUSCULOSKELETAL: Has pain in his hips and other joints related to osteoarthritis. No gout.  NEUROLOGIC: Has a history of previous CVA. No seizures. PSYCHIATRIC: Denies any anxiety or insomnia.   PHYSICAL EXAMINATION:   VITAL SIGNS: Temperature 97.8, pulse 100, respirations 18, blood pressure 152/76.   GENERAL: The patient is a debilitated appearing elderly male.   HEENT: Head atraumatic, normocephalic. Pupils equal, round, reactive to light and accommodation. Oropharynx  is clear. There is no exudate.   NECK: Supple. No JVD. No thyroid enlargement of tenderness.    LUNGS: Clear to auscultation bilaterally. No wheezing, rales, or rhonchi.   CARDIOVASCULAR: S1, S2 positive. No murmurs, rubs, clicks, or gallops.   ABDOMEN: Soft, nontender, nondistended. Positive bowel sounds x4. No hepatosplenomegaly.   EXTREMITIES: Has chronic lymphedema and venostasis changes. He does have Unna boots. His legs are wrapped in Unna boots on both sides.   NEUROLOGICAL: Cranial nerves II through XII grossly intact. Moving all four extremities. Reflexes 2+.   PSYCHIATRIC: The patient was a little agitated earlier by me asking him questions. Later he was okay. He appears a little anxious due to not feeling well.  EVALUATIONS: Glucose 604, BUN 26, creatinine 3.72, sodium 135, potassium 4.9, chloride 102, CO2 23, calcium 9.1. Magnesium 1.9. LFTs showed albumin 3.3, alkaline phosphatase 147, AST 17. Troponin 0.17. TSH 7.82. WBC 6.9, hemoglobin 11.1, platelet count 239.   EKG showed sinus tachycardia, left axis deviation, left bundle branch block.   ASSESSMENT AND PLAN: The patient is a 69 year old white male with multiple medical problems who was in the hospital from 09/13 to 09/14 with hypoglycemia. He has also recently been treated with antibiotics. He is having diarrhea.  1. Generalized weakness due to combination of severe hyperglycemia and diarrhea.  2. Hyperglycemia with blood sugar in the 600's. The patient will be placed on insulin drip. His Lantus will be continued. Basal Lantus dose will also need to be adjusted.  3. Diarrhea with recent antibiotics with recurrent history of Clostridium difficile. Will check a Clostridium difficile.  4. Hypothyroidism. Continue with his thyroid supplements. Will need to be increased based on his low TSH.  5. End-stage renal disease. Needs hemodialysis. Will consult Nephrology.  6. Hyperlipidemia. Will continue simvastatin.   7. Hypertension. Continue Norvasc.  8. Miscellaneous. Will place him on heparin for DVT prophylaxis.   TIME SPENT: 35 minutes.    ____________________________ Lacie ScottsShreyang H. Allena KatzPatel, MD shp:drc D: 05/02/2012 [id number]:24 ET T: 05/03/2012 06:26:31 ET JOB#: [id number]  cc: Sherre Wooton H. Allena KatzPatel, MD, <Dictator> Karie Schwalbeichard I. Letvak, MD Charise CarwinSHREYANG H Imoni Kohen MD ELECTRONICALLY SIGNED 05/05/2012 13:14

## 2014-11-24 NOTE — Op Note (Signed)
PATIENT NAME:  Miguel OrleansDOBBINS, Fairley E MR#:  [id number] DATE OF BIRTH:  Oct 19, 1945  DATE OF PROCEDURE:  09/09/2011  PREOPERATIVE DIAGNOSES:  1. End-stage renal disease.  2. 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  3. Heart disease.  4. Neuropathy.   POSTOPERATIVE DIAGNOSES: 1. End-stage renal disease.  2. 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  3. Heart disease.  4. Neuropathy.   PROCEDURE: Banding of right arm brachial basilic AV fistula.   SURGEON: Annice NeedyJason S. Ivyrose Hashman, MD   ANESTHESIA: General.   ESTIMATED BLOOD LOSS: Minimal.  INDICATION FOR PROCEDURE: This is a 69 year old white male with right arm AV access. He has had previous steal syndrome and a banding several years ago. This improved his symptoms. We have actually dilated this a couple of times over time to improve fistula flow and his steal symptoms have markedly worsened over the last several months. He comes back today for another banding procedure to help alleviate his steal symptoms in hopes of maintaining his fistula's function. Risks and benefits were discussed. Informed consent was obtained.   DESCRIPTION OF PROCEDURE: The patient was brought to the operative suite and after an adequate level of general endotracheal anesthesia was obtained, his right upper extremity was sterilely prepped and draped and a sterile surgical field was created. A transverse incision was created overlying the fistula approximately 8 cm above the antecubital fossa anastomosis. The fistula was dissected out. We used continuous wave Doppler for both the fistula as well as the radial artery distally to help determine the tightness of the band. Two 2-0 silk ties were used. These were tightened down until the radial pulse improved distally but the fistula flow remained present in the axillary region. Once these ties were in place, wound was copiously irrigated and then we closed with wound with a 3-0 Vicryl  and a 4-0 Monocryl. Dermabond was placed as a dressing. The patient tolerated the procedure well and was taken to the recovery room in stable condition.   ____________________________ Annice NeedyJason S. Capers Hagmann, MD jsd:drc D: 09/09/2011 13:44:07 ET T: 09/09/2011 13:56:52 ET JOB#: [id number]  cc: Annice NeedyJason S. Bascom Biel, MD, <Dictator> Munsoor Lizabeth LeydenN. Lateef, MD Mosetta PigeonHarmeet Singh, MD Annice NeedyJASON S Tajai Suder MD ELECTRONICALLY SIGNED 09/20/2011 9:24

## 2014-11-24 NOTE — Op Note (Signed)
PATIENT NAME:  [name], [name] MR#:  [id number] DATE OF BIRTH:  [birth year]  DATE OF PROCEDURE:  07/29/2011  PREOPERATIVE DIAGNOSES:  1. Right hand steal syndrome.  2. End-stage renal disease requiring hemodialysis.  3. Complication AV dialysis device.   POSTOPERATIVE DIAGNOSES:  1. Right hand steal syndrome.  2. End-stage renal disease requiring hemodialysis.  3. Complication AV dialysis device.   PROCEDURES PERFORMED:  1. Arch aortogram in the LAO projection.  2. Right upper extremity angiography, third order catheter placement.   SURGEON:  Renford DillsGregory G. Schnier, MD.   SEDATION: Versed 3 mg plus fentanyl 100 mcg administered IV. Continuous ECG, pulse oximetry and cardiopulmonary monitoring was performed throughout the entire procedure by the interventional radiology nurse. Total sedation time is 45 minutes.   ACCESS: 6 French sheath left common femoral artery.  CONTRAST USED: Isovue 50 mL.  FLUORO TIME: 3.2 minutes.   INDICATIONS: Mr. [name] is a 69 year old gentleman who now is having increasing pain in his left hand particularly with dialysis consistent with steal syndrome. He is now undergoing angiography in preparation for surgical correction. The risks and benefits are reviewed, all questions are answered. The patient agrees to proceed.   DESCRIPTION OF PROCEDURE: The patient is taken to special procedures and placed in the supine position. After adequate sedation is achieved, the left groin is prepped and draped in sterile fashion. 1% lidocaine is infiltrated into soft tissues and access to the left groin is obtained with a micropuncture needle, microwire followed by micro sheath is inserted. A 5 French pigtail catheter is then advanced into the ascending aorta. LAO projection of the arch is performed. A 6 French sheath is then exchanged for the 5 [place] sheath and a 5.5 [place] JB-1 catheter is advanced and used to engage the innominate and subsequently the subclavian is then advanced  out into the brachial. Serial imaging is obtained of the right arm with the catheter tip down at the trifurcation. This represents third order catheter placement.   The sheath is then pulled. Oblique view of the right groin is obtained. A StarClose device is deployed successfully. There are no immediate complications.   INTERPRETATION: Aortic arch is opacified with bolus injection of contrast. There are no ostial lesions within the great vessels. There are no hemodynamically significant stenoses within the innominate subclavian, axillary or brachial artery. Virtually all the contrast injected at the level of the brachial artery goes into the graft and there is none that proceeds distally. This is consistent with steal. Oblique view shows the anastomosis to be widely patent and approximately 6 to 7 millimeters in diameter. The catheter is then advanced beyond the fistula and distal runoff is obtained which appears otherwise normal.   SUMMARY: Right arm angiography demonstrating steal syndrome, rebanding of his fistula would be appropriate in this indication. However, if DRIL procedure is required, he does have adequate distal runoff.   ____________________________ Renford DillsGregory G. Schnier, MD ggs:ap D: 07/30/2011 09:59:44 ET T: 07/30/2011 10:26:10 ET JOB#: [id number]  cc: Renford DillsGregory G. Schnier, MD, <Dictator> Renford DillsGREGORY G SCHNIER MD ELECTRONICALLY SIGNED 08/04/2011 16:45
